# Patient Record
Sex: Female | Born: 1980 | Race: White | Hispanic: Yes | Marital: Married | Smoking: Never smoker
Health system: Southern US, Community
[De-identification: ages and names within clinical notes are randomized; demographics above are authoritative.]

## PROBLEM LIST (undated history)

## (undated) HISTORY — PX: ABDOMINAL HYSTERECTOMY: SHX81

---

## 2002-02-02 ENCOUNTER — Inpatient Hospital Stay (HOSPITAL_COMMUNITY): Admission: AD | Admit: 2002-02-02 | Discharge: 2002-02-04 | Payer: Self-pay | Admitting: Obstetrics and Gynecology

## 2004-06-29 ENCOUNTER — Ambulatory Visit: Payer: Self-pay | Admitting: Family Medicine

## 2004-07-02 ENCOUNTER — Other Ambulatory Visit: Admission: RE | Admit: 2004-07-02 | Discharge: 2004-07-02 | Payer: Self-pay | Admitting: Family Medicine

## 2004-07-02 ENCOUNTER — Ambulatory Visit: Payer: Self-pay | Admitting: Family Medicine

## 2004-07-28 ENCOUNTER — Ambulatory Visit: Payer: Self-pay | Admitting: Family Medicine

## 2004-07-29 ENCOUNTER — Ambulatory Visit (HOSPITAL_COMMUNITY): Admission: RE | Admit: 2004-07-29 | Discharge: 2004-07-29 | Payer: Self-pay | Admitting: Internal Medicine

## 2004-08-21 ENCOUNTER — Ambulatory Visit: Payer: Self-pay | Admitting: Sports Medicine

## 2004-10-02 ENCOUNTER — Ambulatory Visit: Payer: Self-pay | Admitting: Family Medicine

## 2004-10-05 ENCOUNTER — Ambulatory Visit: Payer: Self-pay | Admitting: Family Medicine

## 2004-10-21 ENCOUNTER — Ambulatory Visit: Payer: Self-pay | Admitting: Family Medicine

## 2004-11-06 ENCOUNTER — Ambulatory Visit: Payer: Self-pay | Admitting: Sports Medicine

## 2004-11-26 ENCOUNTER — Ambulatory Visit: Payer: Self-pay | Admitting: Family Medicine

## 2004-12-01 ENCOUNTER — Ambulatory Visit: Payer: Self-pay | Admitting: Family Medicine

## 2004-12-08 ENCOUNTER — Inpatient Hospital Stay (HOSPITAL_COMMUNITY): Admission: AD | Admit: 2004-12-08 | Discharge: 2004-12-09 | Payer: Self-pay | Admitting: *Deleted

## 2004-12-09 ENCOUNTER — Ambulatory Visit: Payer: Self-pay | Admitting: Family Medicine

## 2004-12-10 ENCOUNTER — Ambulatory Visit: Payer: Self-pay | Admitting: Family Medicine

## 2004-12-10 ENCOUNTER — Inpatient Hospital Stay (HOSPITAL_COMMUNITY): Admission: AD | Admit: 2004-12-10 | Discharge: 2004-12-11 | Payer: Self-pay | Admitting: Family Medicine

## 2004-12-13 ENCOUNTER — Ambulatory Visit: Payer: Self-pay | Admitting: *Deleted

## 2004-12-13 ENCOUNTER — Inpatient Hospital Stay (HOSPITAL_COMMUNITY): Admission: AD | Admit: 2004-12-13 | Discharge: 2004-12-13 | Payer: Self-pay | Admitting: Family Medicine

## 2004-12-16 ENCOUNTER — Ambulatory Visit: Payer: Self-pay | Admitting: Sports Medicine

## 2004-12-23 ENCOUNTER — Encounter (INDEPENDENT_AMBULATORY_CARE_PROVIDER_SITE_OTHER): Payer: Self-pay | Admitting: Specialist

## 2004-12-23 ENCOUNTER — Ambulatory Visit: Payer: Self-pay | Admitting: Obstetrics & Gynecology

## 2004-12-23 ENCOUNTER — Ambulatory Visit: Payer: Self-pay | Admitting: Family Medicine

## 2004-12-23 ENCOUNTER — Inpatient Hospital Stay (HOSPITAL_COMMUNITY): Admission: AD | Admit: 2004-12-23 | Discharge: 2004-12-25 | Payer: Self-pay | Admitting: *Deleted

## 2005-02-19 ENCOUNTER — Ambulatory Visit: Payer: Self-pay | Admitting: Family Medicine

## 2005-05-24 ENCOUNTER — Inpatient Hospital Stay (HOSPITAL_COMMUNITY): Admission: EM | Admit: 2005-05-24 | Discharge: 2005-05-26 | Payer: Self-pay | Admitting: Emergency Medicine

## 2005-05-25 ENCOUNTER — Encounter (INDEPENDENT_AMBULATORY_CARE_PROVIDER_SITE_OTHER): Payer: Self-pay | Admitting: *Deleted

## 2005-06-17 ENCOUNTER — Encounter (INDEPENDENT_AMBULATORY_CARE_PROVIDER_SITE_OTHER): Payer: Self-pay | Admitting: *Deleted

## 2005-07-07 ENCOUNTER — Ambulatory Visit: Payer: Self-pay | Admitting: Sports Medicine

## 2005-07-07 ENCOUNTER — Other Ambulatory Visit: Admission: RE | Admit: 2005-07-07 | Discharge: 2005-07-07 | Payer: Self-pay | Admitting: Family Medicine

## 2006-07-15 ENCOUNTER — Encounter (INDEPENDENT_AMBULATORY_CARE_PROVIDER_SITE_OTHER): Payer: Self-pay | Admitting: *Deleted

## 2007-08-16 IMAGING — US US ABDOMEN COMPLETE
1 series · 14 of 25 positions shown · non-contrast
Comparison: none

CLINICAL DATA: Right-sided abdominal pain.  The patient speaks no English.  Point tenderness over gallbladder.
 ABDOMEN ULTRASOUND:
TECHNIQUE: Complete abdominal ultrasound examination was performed including evaluation of the liver, gallbladder, bile ducts, pancreas, kidneys, spleen, IVC, and abdominal aorta.

[Series 1: unknown · 0.25mm/px · 14 of 92 slices shown]
[im 1/92]
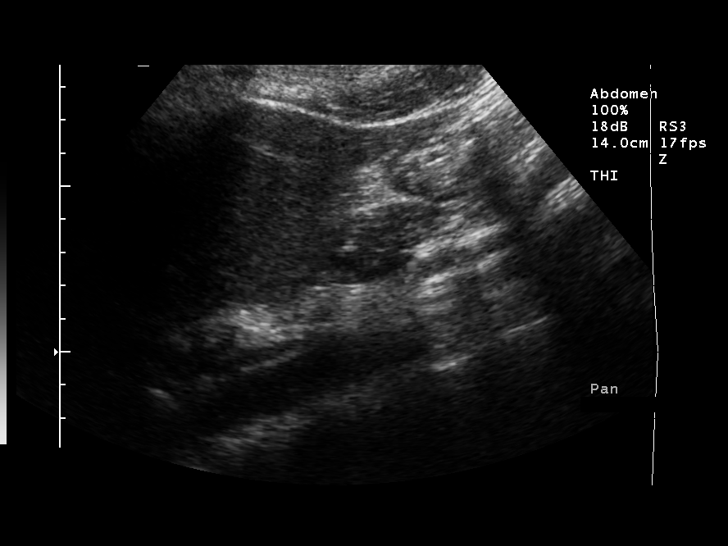
[im 8/92]
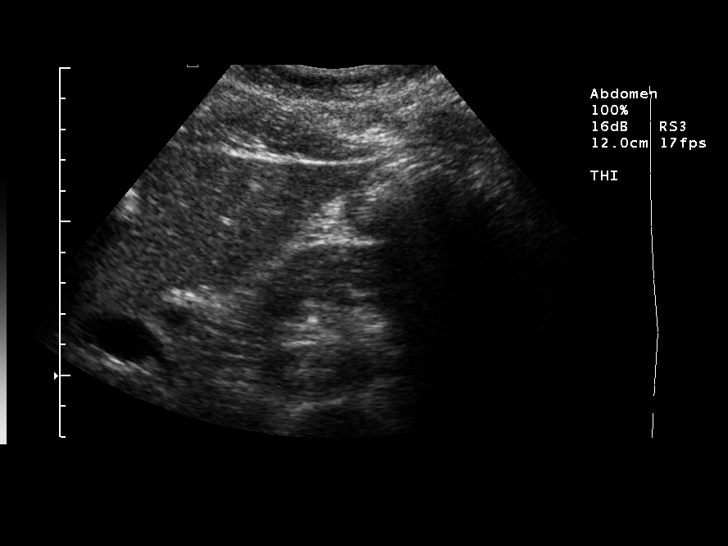
[im 16/92]
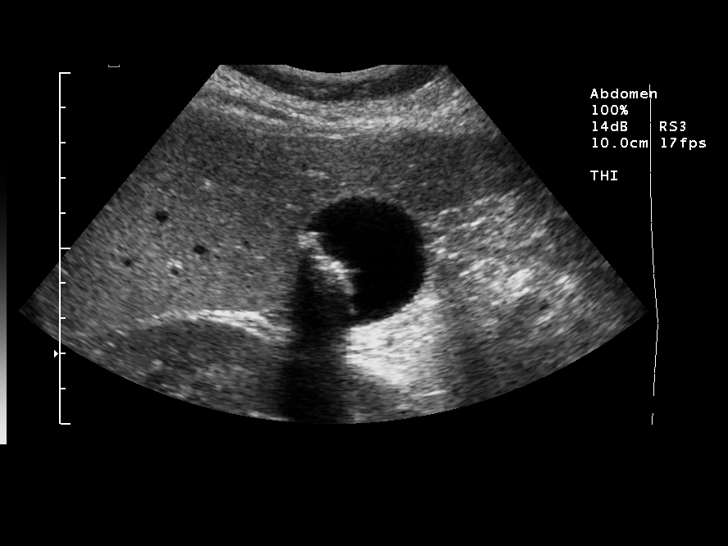
[im 23/92]
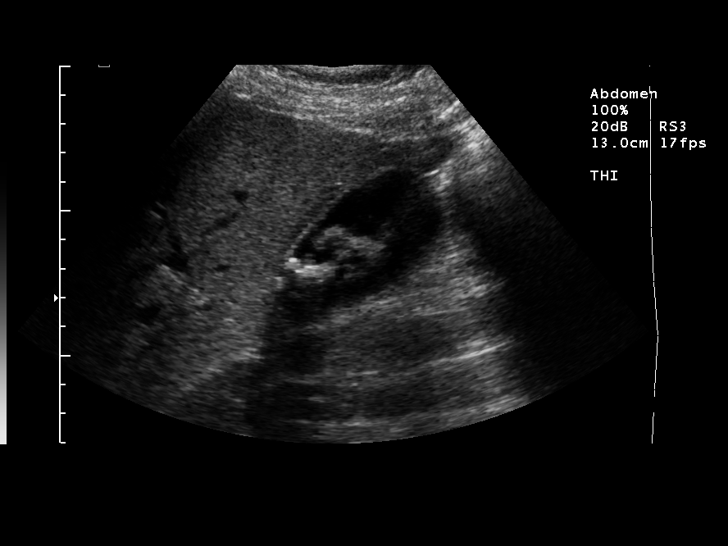
[im 31/92]
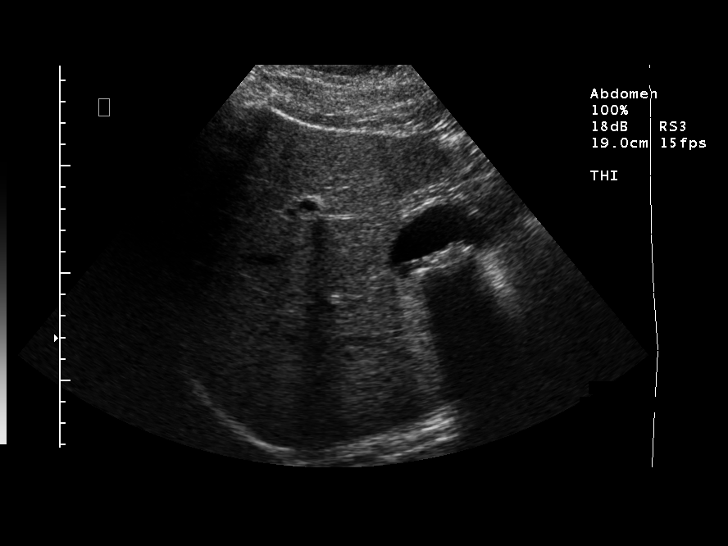
[im 35/92]
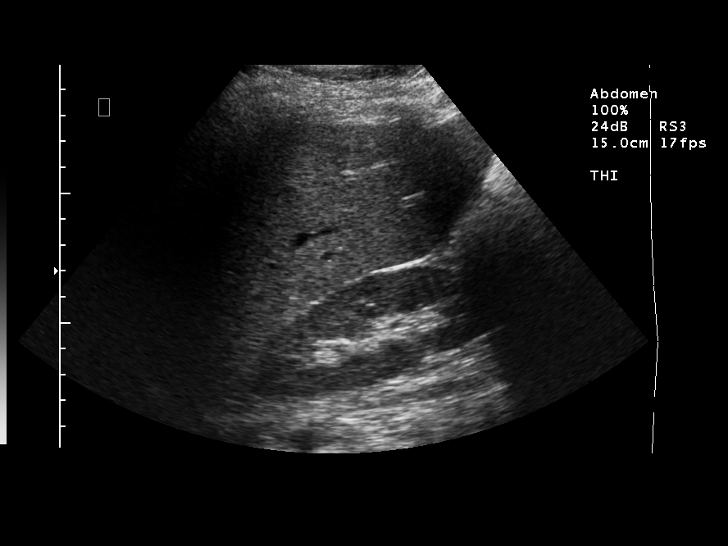
[im 42/92]
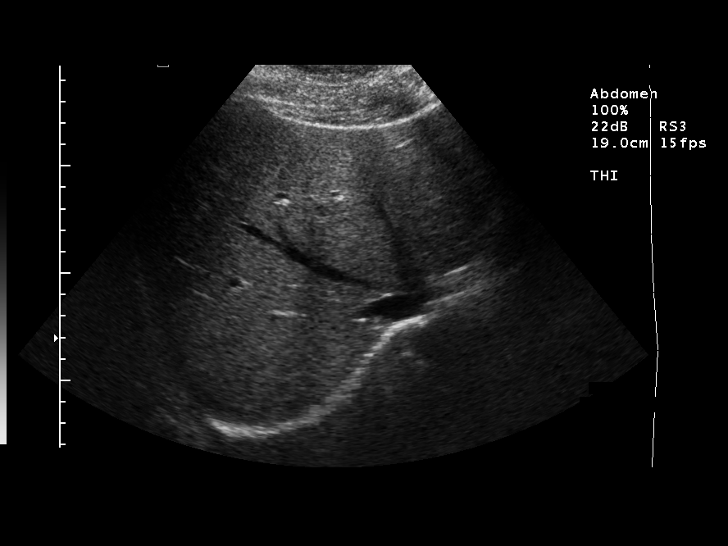
[im 50/92]
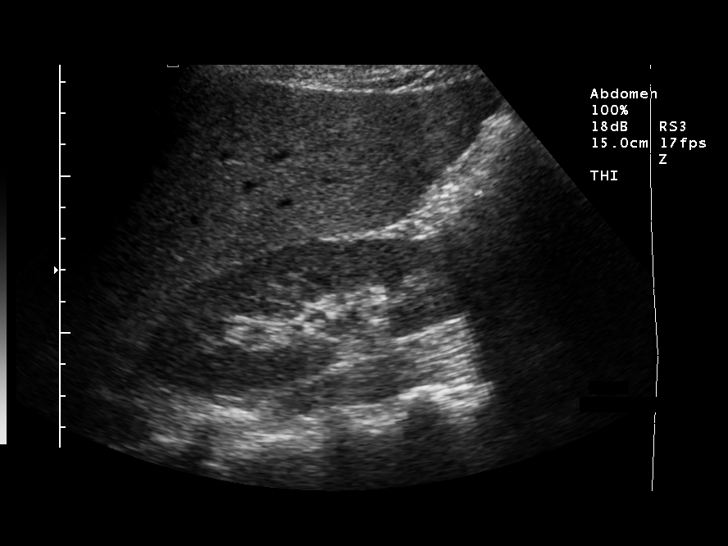
[im 57/92]
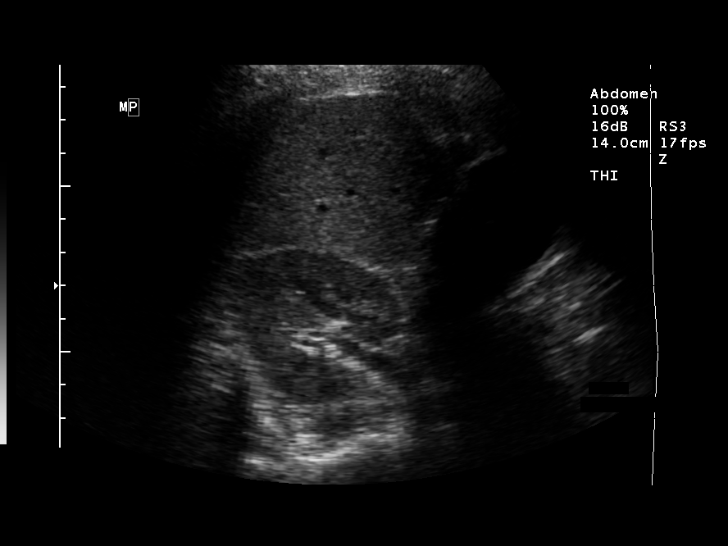
[im 61/92]
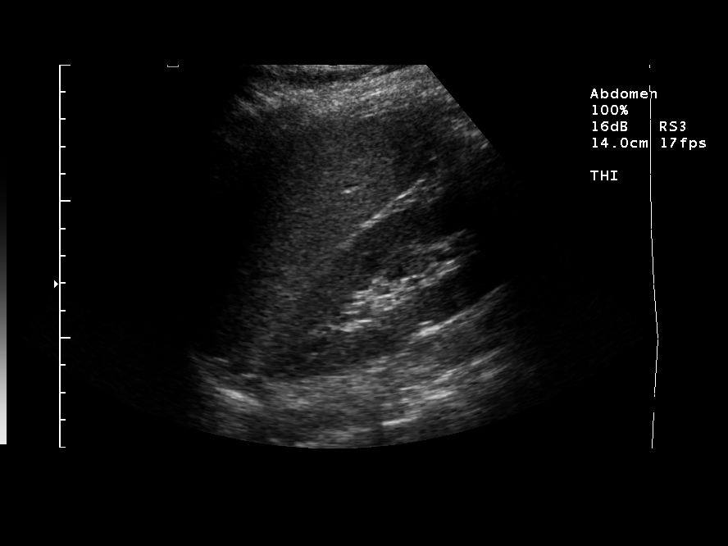
[im 69/92]
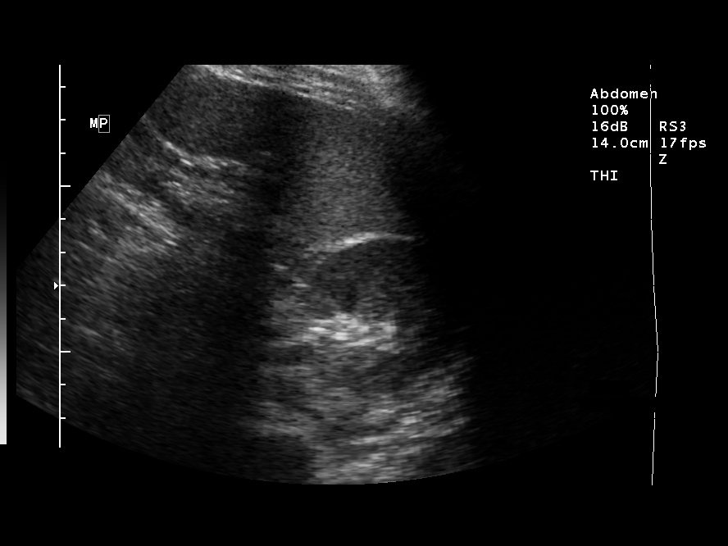
[im 76/92]
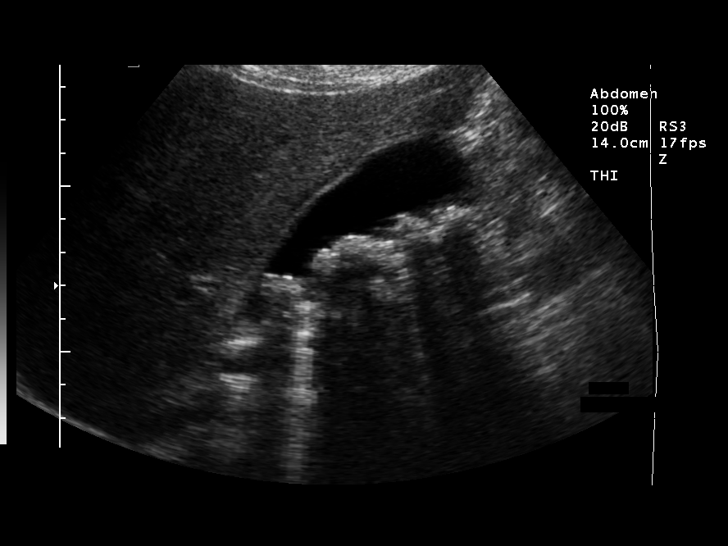
[im 84/92]
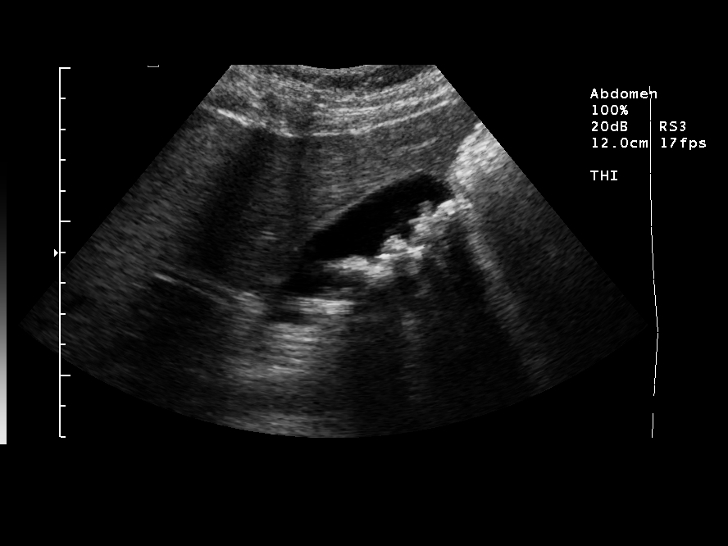
[im 92/92]
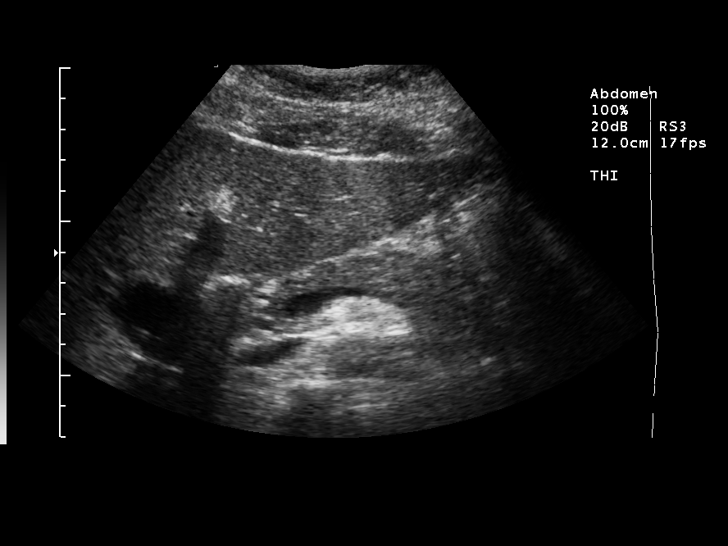

[14 of 25 positions shown; findings below may reference images not displayed]

FINDINGS: Multiple scans of the abdomen are made and show multiple gallstones within the gallbladder.  There is point tenderness over the gallbladder, and there is a small amount of pericholecystic fluid. Gallbladder wall thickness is 2.1 mm.  The common bile duct is normal and measures 4.9 mm.  The liver, inferior vena cava, and pancreas appear normal.  The spleen is normal and measures 11.4 cm.  The right kidney is normal and measures 10.9 cm.  The left kidney is normal and measures 11.4 cm.  The abdominal aorta is normal and measures 1.7 cm.
IMPRESSION: Multiple gallstones within a tender gallbladder which has a small amount of pericholecystic fluid.  Wall thickness is 2.1 mm.  Normal common bile duct, liver, and pancreas.

## 2007-08-17 IMAGING — RF DG CHOLANGIOGRAM OPERATIVE
1 series · 4 of 4 positions shown · non-contrast
Comparison: none

CLINICAL DATA: Acute cholecystitis.  
 OPERATIVE CHOLANGIOGRAM:

[Series 1: run · 4 of 64 frames shown]
[frame 3/64]
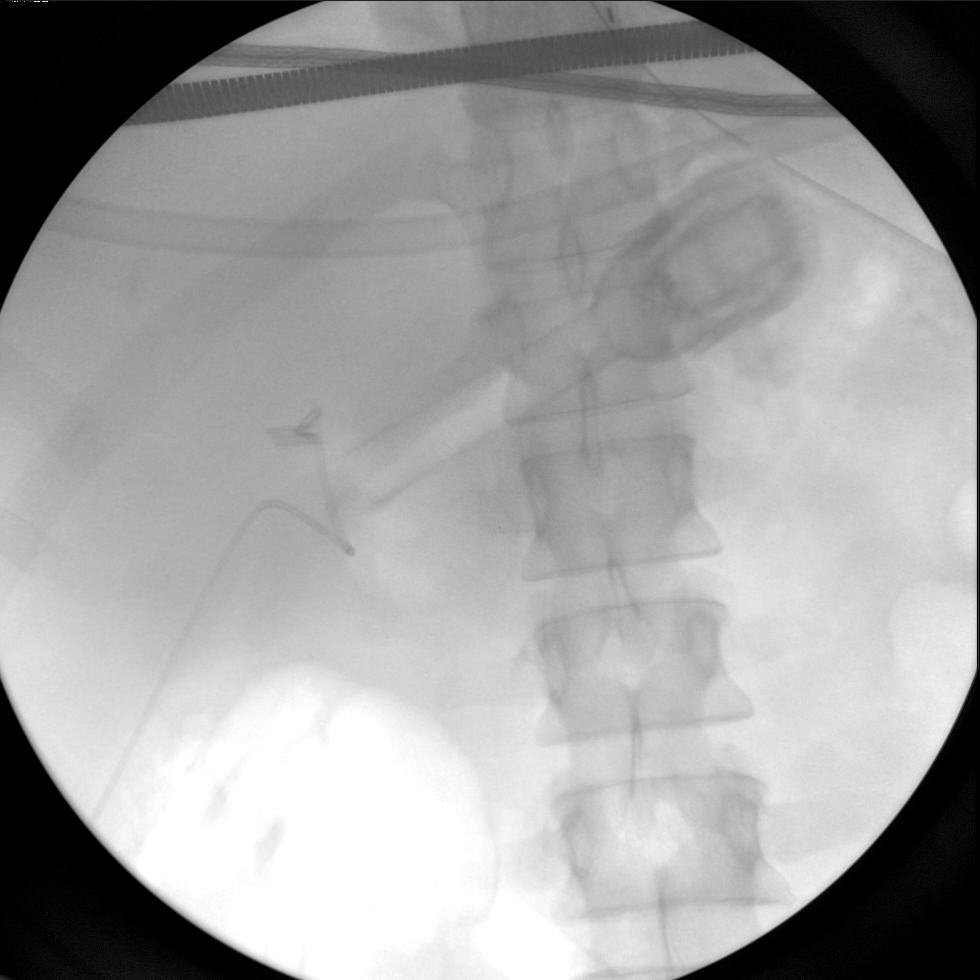
[frame 10/64]
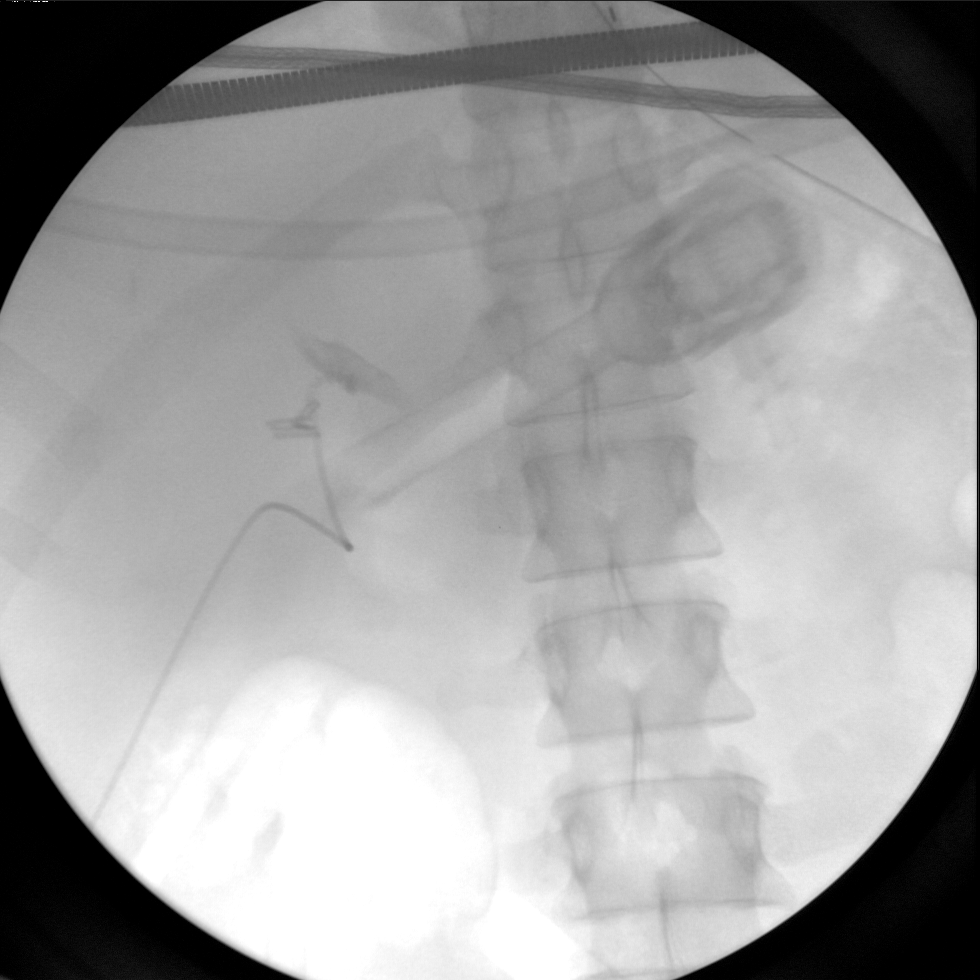
[frame 33/64]
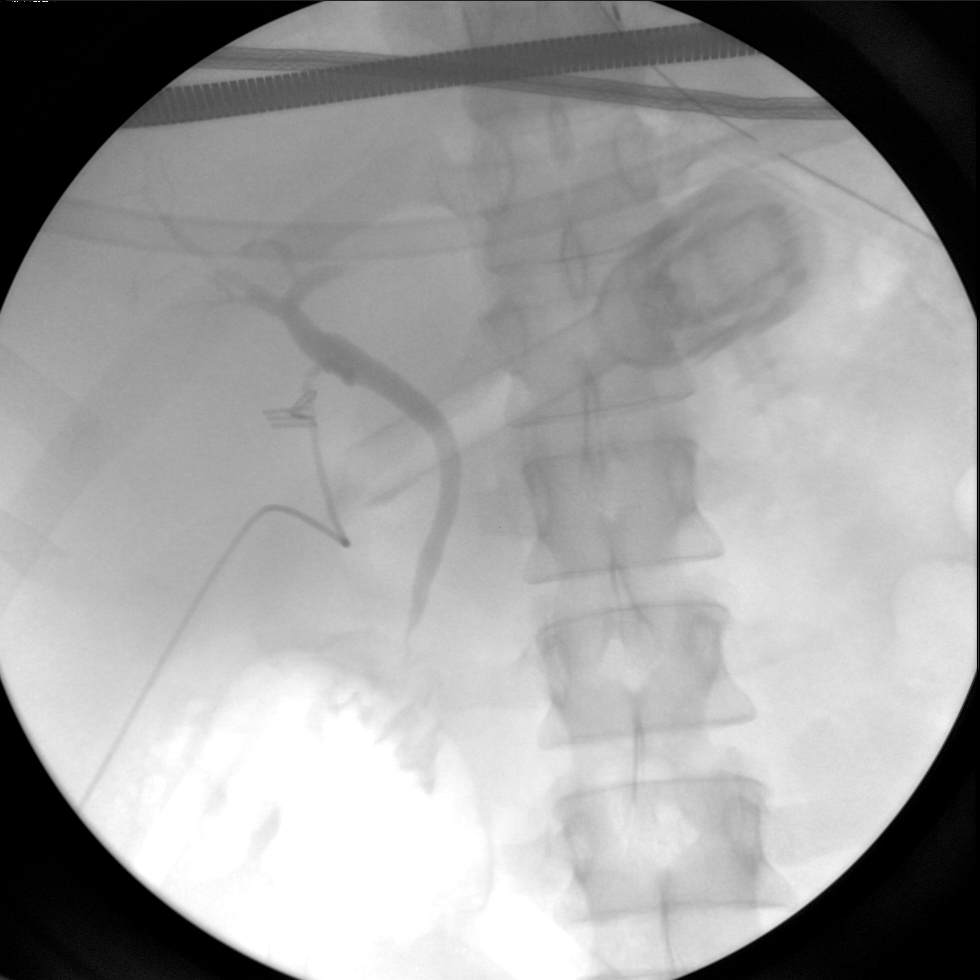
[frame 55/64]
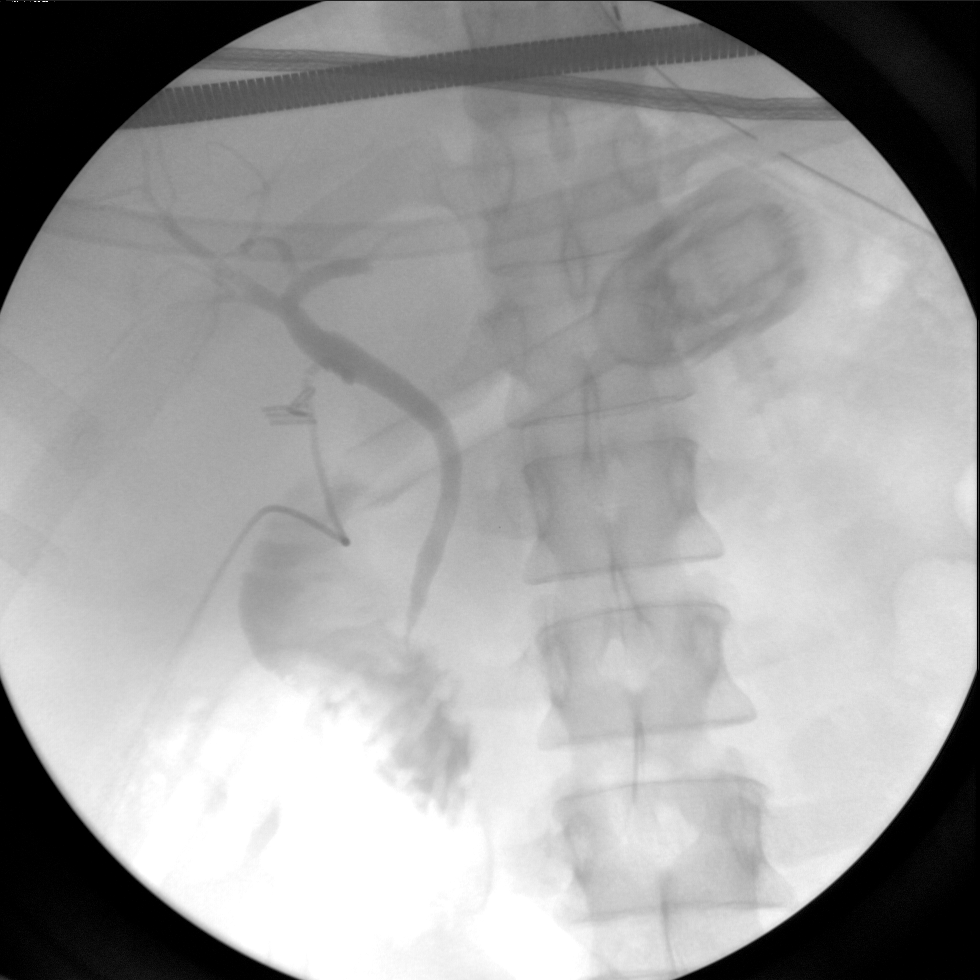

[4 of 4 positions shown; findings below may reference images not displayed]

FINDINGS: An intraoperative C-arm run is reviewed on PACS.  The intra and extrahepatic biliary ducts are normal in caliber.  No filling defects or obstruction.
IMPRESSION: Satisfactory operative cholangiogram.

## 2007-10-20 ENCOUNTER — Encounter (INDEPENDENT_AMBULATORY_CARE_PROVIDER_SITE_OTHER): Payer: Self-pay | Admitting: Family Medicine

## 2007-12-15 ENCOUNTER — Ambulatory Visit: Payer: Self-pay | Admitting: Internal Medicine

## 2007-12-15 ENCOUNTER — Encounter: Payer: Self-pay | Admitting: Family Medicine

## 2007-12-15 LAB — CONVERTED CEMR LAB
BUN: 12 mg/dL (ref 6–23)
Basophils Relative: 0 % (ref 0–1)
Calcium: 8.8 mg/dL (ref 8.4–10.5)
Eosinophils Absolute: 0.3 10*3/uL (ref 0.0–0.7)
Eosinophils Relative: 4 % (ref 0–5)
HCT: 38.7 % (ref 36.0–46.0)
MCHC: 33.9 g/dL (ref 30.0–36.0)
MCV: 90.6 fL (ref 78.0–100.0)
Neutrophils Relative %: 66 % (ref 43–77)
Platelets: 232 10*3/uL (ref 150–400)
Potassium: 4.1 meq/L (ref 3.5–5.3)
Sodium: 136 meq/L (ref 135–145)
TSH: 2.863 microintl units/mL (ref 0.350–4.50)

## 2007-12-16 ENCOUNTER — Ambulatory Visit: Payer: Self-pay | Admitting: *Deleted

## 2007-12-21 ENCOUNTER — Ambulatory Visit (HOSPITAL_COMMUNITY): Admission: RE | Admit: 2007-12-21 | Discharge: 2007-12-21 | Payer: Self-pay | Admitting: Family Medicine

## 2008-02-14 ENCOUNTER — Ambulatory Visit: Payer: Self-pay | Admitting: Obstetrics and Gynecology

## 2008-02-16 ENCOUNTER — Emergency Department (HOSPITAL_COMMUNITY): Admission: EM | Admit: 2008-02-16 | Discharge: 2008-02-16 | Payer: Self-pay | Admitting: Emergency Medicine

## 2008-02-22 ENCOUNTER — Ambulatory Visit (HOSPITAL_COMMUNITY): Admission: RE | Admit: 2008-02-22 | Discharge: 2008-02-22 | Payer: Self-pay | Admitting: Obstetrics and Gynecology

## 2008-03-04 ENCOUNTER — Encounter: Payer: Self-pay | Admitting: Obstetrics & Gynecology

## 2008-03-04 ENCOUNTER — Inpatient Hospital Stay (HOSPITAL_COMMUNITY): Admission: RE | Admit: 2008-03-04 | Discharge: 2008-03-06 | Payer: Self-pay | Admitting: Obstetrics & Gynecology

## 2008-03-04 ENCOUNTER — Ambulatory Visit: Payer: Self-pay | Admitting: Obstetrics & Gynecology

## 2008-03-11 ENCOUNTER — Inpatient Hospital Stay (HOSPITAL_COMMUNITY): Admission: AD | Admit: 2008-03-11 | Discharge: 2008-03-14 | Payer: Self-pay | Admitting: Family Medicine

## 2008-03-11 ENCOUNTER — Ambulatory Visit: Payer: Self-pay | Admitting: Obstetrics & Gynecology

## 2008-03-12 ENCOUNTER — Encounter: Payer: Self-pay | Admitting: Obstetrics & Gynecology

## 2008-03-18 ENCOUNTER — Ambulatory Visit (HOSPITAL_COMMUNITY): Admission: RE | Admit: 2008-03-18 | Discharge: 2008-03-18 | Payer: Self-pay | Admitting: Obstetrics & Gynecology

## 2008-04-04 ENCOUNTER — Ambulatory Visit (HOSPITAL_COMMUNITY): Admission: RE | Admit: 2008-04-04 | Discharge: 2008-04-04 | Payer: Self-pay | Admitting: Interventional Radiology

## 2008-04-05 ENCOUNTER — Encounter: Payer: Self-pay | Admitting: Family

## 2008-04-05 ENCOUNTER — Ambulatory Visit: Payer: Self-pay | Admitting: Obstetrics and Gynecology

## 2008-04-05 LAB — CONVERTED CEMR LAB: Trich, Wet Prep: NONE SEEN

## 2008-04-18 ENCOUNTER — Inpatient Hospital Stay (HOSPITAL_COMMUNITY): Admission: RE | Admit: 2008-04-18 | Discharge: 2008-04-19 | Payer: Self-pay | Admitting: Urology

## 2008-04-26 ENCOUNTER — Emergency Department (HOSPITAL_COMMUNITY): Admission: EM | Admit: 2008-04-26 | Discharge: 2008-04-27 | Payer: Self-pay | Admitting: Emergency Medicine

## 2008-04-29 ENCOUNTER — Emergency Department (HOSPITAL_COMMUNITY): Admission: EM | Admit: 2008-04-29 | Discharge: 2008-04-30 | Payer: Self-pay | Admitting: Emergency Medicine

## 2008-05-03 ENCOUNTER — Ambulatory Visit: Payer: Self-pay | Admitting: Obstetrics & Gynecology

## 2008-06-05 ENCOUNTER — Ambulatory Visit: Payer: Self-pay | Admitting: Obstetrics and Gynecology

## 2008-07-29 ENCOUNTER — Ambulatory Visit: Payer: Self-pay | Admitting: Internal Medicine

## 2008-08-19 ENCOUNTER — Ambulatory Visit: Payer: Self-pay | Admitting: Internal Medicine

## 2008-08-19 ENCOUNTER — Encounter: Payer: Self-pay | Admitting: Family Medicine

## 2008-08-19 LAB — CONVERTED CEMR LAB
BUN: 17 mg/dL (ref 6–23)
CO2: 19 meq/L (ref 19–32)
Calcium: 9.3 mg/dL (ref 8.4–10.5)
Glucose, Bld: 91 mg/dL (ref 70–99)
Potassium: 4.2 meq/L (ref 3.5–5.3)
Sodium: 139 meq/L (ref 135–145)

## 2008-08-29 ENCOUNTER — Ambulatory Visit: Payer: Self-pay | Admitting: Internal Medicine

## 2008-11-11 ENCOUNTER — Ambulatory Visit: Payer: Self-pay | Admitting: Internal Medicine

## 2008-11-13 ENCOUNTER — Ambulatory Visit (HOSPITAL_COMMUNITY): Admission: RE | Admit: 2008-11-13 | Discharge: 2008-11-13 | Payer: Self-pay | Admitting: Family Medicine

## 2008-11-15 ENCOUNTER — Emergency Department (HOSPITAL_COMMUNITY): Admission: EM | Admit: 2008-11-15 | Discharge: 2008-11-16 | Payer: Self-pay | Admitting: Emergency Medicine

## 2008-11-20 ENCOUNTER — Ambulatory Visit: Payer: Self-pay | Admitting: Internal Medicine

## 2008-11-27 ENCOUNTER — Ambulatory Visit: Payer: Self-pay | Admitting: *Deleted

## 2008-12-13 ENCOUNTER — Ambulatory Visit (HOSPITAL_COMMUNITY): Admission: RE | Admit: 2008-12-13 | Discharge: 2008-12-13 | Payer: Self-pay | Admitting: Urology

## 2009-01-22 ENCOUNTER — Encounter: Payer: Self-pay | Admitting: Family Medicine

## 2009-01-22 ENCOUNTER — Ambulatory Visit: Payer: Self-pay | Admitting: Internal Medicine

## 2009-01-22 LAB — CONVERTED CEMR LAB
Alkaline Phosphatase: 53 units/L (ref 39–117)
BUN: 11 mg/dL (ref 6–23)
Basophils Absolute: 0 10*3/uL (ref 0.0–0.1)
Basophils Relative: 0 % (ref 0–1)
CO2: 18 meq/L — ABNORMAL LOW (ref 19–32)
Cholesterol: 201 mg/dL — ABNORMAL HIGH (ref 0–200)
Creatinine, Ser: 0.68 mg/dL (ref 0.40–1.20)
Eosinophils Absolute: 0.1 10*3/uL (ref 0.0–0.7)
Eosinophils Relative: 2 % (ref 0–5)
Glucose, Bld: 96 mg/dL (ref 70–99)
HCT: 40.5 % (ref 36.0–46.0)
HDL: 38 mg/dL — ABNORMAL LOW (ref >39)
Hemoglobin: 13.1 g/dL (ref 12.0–15.0)
LDL Cholesterol: 92 mg/dL (ref 0–99)
MCHC: 32.3 g/dL (ref 30.0–36.0)
MCV: 95.3 fL (ref 78.0–100.0)
Monocytes Absolute: 0.3 10*3/uL (ref 0.1–1.0)
Monocytes Relative: 5 % (ref 3–12)
Neutro Abs: 3.6 10*3/uL (ref 1.7–7.7)
RBC: 4.25 M/uL (ref 3.87–5.11)
RDW: 13.9 % (ref 11.5–15.5)
Sodium: 139 meq/L (ref 135–145)
Total Bilirubin: 0.5 mg/dL (ref 0.3–1.2)
Total Protein: 7.3 g/dL (ref 6.0–8.3)
Triglycerides: 353 mg/dL — ABNORMAL HIGH (ref <150)
VLDL: 71 mg/dL — ABNORMAL HIGH (ref 0–40)

## 2009-01-29 ENCOUNTER — Ambulatory Visit: Payer: Self-pay | Admitting: Internal Medicine

## 2009-03-24 ENCOUNTER — Ambulatory Visit: Payer: Self-pay | Admitting: Internal Medicine

## 2009-05-08 ENCOUNTER — Ambulatory Visit: Payer: Self-pay | Admitting: Family Medicine

## 2010-03-05 ENCOUNTER — Emergency Department (HOSPITAL_COMMUNITY)
Admission: EM | Admit: 2010-03-05 | Discharge: 2010-03-06 | Payer: Self-pay | Source: Home / Self Care | Admitting: Emergency Medicine

## 2010-04-23 ENCOUNTER — Ambulatory Visit: Payer: Self-pay | Admitting: Obstetrics & Gynecology

## 2010-04-27 ENCOUNTER — Ambulatory Visit (HOSPITAL_COMMUNITY)
Admission: RE | Admit: 2010-04-27 | Discharge: 2010-04-27 | Payer: Self-pay | Source: Home / Self Care | Attending: Family Medicine | Admitting: Family Medicine

## 2010-06-02 ENCOUNTER — Emergency Department (HOSPITAL_COMMUNITY)
Admission: EM | Admit: 2010-06-02 | Discharge: 2010-06-02 | Payer: Self-pay | Source: Home / Self Care | Admitting: Emergency Medicine

## 2010-06-04 IMAGING — US IR US GUIDANCE
1 series · 1 of 1 positions shown · non-contrast
Comparison: none

CLINICAL DATA: Distal left ureteral injury following hysterectomy
with CT evidence of significant hydronephrosis and delayed function
of the left kidney.  The patient presents for percutaneous
nephrostomy.

[Series 1: sp us guide bx asp/drain · 1 of 1 slices shown]
[im 1/1]
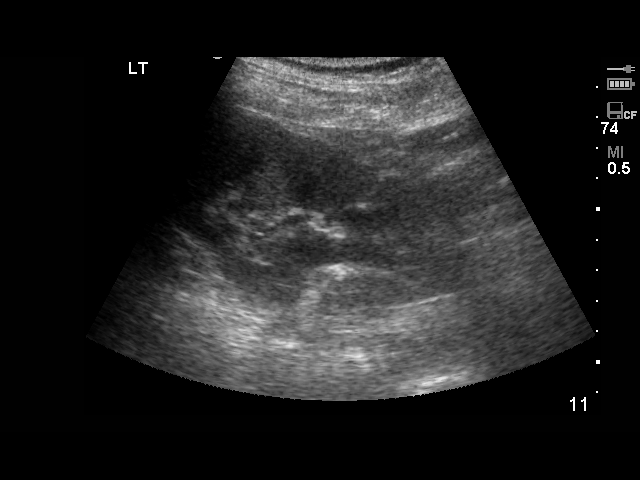

[1 of 1 positions shown; findings below may reference images not displayed]

LEFT PERCUTANEOUS NEPHROSTOMY TUBE PLACEMENT WITH ULTRASOUND
GUIDANCE FOR NEEDLE PUNCTURE

Sedation: 6.0 mg IV Versed; 200 mcg IV Fentanyl.

Total Moderate Sedation Time: 35 minutes.

Contrast Volume: 10 ml

Fluoroscropy Time: 3.3 minutes.

Procedure: The procedure, risks, benefits, and alternatives were
explained to the patient. Questions regarding the procedure were
encouraged and answered. The patient understands and consents to
the procedure.

The left flank region was prepped with betadine in a sterile
fashion, and a sterile drape was applied covering the operative
field. A sterile gown and sterile gloves were used for the
procedure. Local anesthesia was provided with 1% Lidocaine.

The left kidney was localized by ultrasound.  Under direct
ultrasound guidance a 21-gauge needle was advanced into the lower
pole collecting system.  Ultrasound image documentation was
performed of needle position.

After aspiration of urine, diluted contrast material was injected
into the collecting system.  A guidewire was then advanced.  A
transitional dilator was placed.  The percutaneous tract was then
dilated to 10-French.

A 10-French pigtail nephrostomy tube was advanced and formed.  The
catheter was injected with contrast material and a fluoroscopic
spot image obtained to confirm position.  The catheter was flushed
and connected to a gravity drainage bag.  It was secured at the
skin with a Prolene retention suture and Stat-Lock device.

Complications: None
FINDINGS: Ultrasound confirms significant residual left
hydronephrosis.  Aspirated urine was clear.  A sample was sent for
culture.  The nephrostomy tube was formed at the level of the renal
pelvis.

Attempt was not made today to try to cross the level of distal
ureteral obstruction as it is unlikely to be successful this early
after surgery.  The patient will be brought back approximately 1
week for nephrostogram evaluation and possible antegrade ureteral
stent placement.
IMPRESSION: Left percutaneous nephrostomy tube placement for hydronephrosis
secondary to distal ureteral obstruction/injury.  A 10 French tube
was formed at the level of the renal pelvis.  Plans are to have the
patient return in approximately 1 week for attempted antegrade
ureteral stent placement which would then allow removal of the
nephrostomy tube.

## 2010-06-06 ENCOUNTER — Encounter: Payer: Self-pay | Admitting: Family Medicine

## 2010-06-09 ENCOUNTER — Encounter: Payer: Self-pay | Admitting: Family Medicine

## 2010-06-09 LAB — CONVERTED CEMR LAB
AST: 15 units/L (ref 0–37)
Alkaline Phosphatase: 48 units/L (ref 39–117)
BUN: 14 mg/dL (ref 6–23)
Basophils Relative: 0 % (ref 0–1)
Eosinophils Absolute: 0.1 10*3/uL (ref 0.0–0.7)
Eosinophils Relative: 2 % (ref 0–5)
Glucose, Bld: 84 mg/dL (ref 70–99)
HCT: 39.7 % (ref 36.0–46.0)
HDL: 54 mg/dL (ref >39)
LDL Cholesterol: 109 mg/dL — ABNORMAL HIGH (ref 0–99)
Lymphs Abs: 2.2 10*3/uL (ref 0.7–4.0)
MCHC: 33.8 g/dL (ref 30.0–36.0)
MCV: 93.2 fL (ref 78.0–100.0)
Monocytes Relative: 5 % (ref 3–12)
Platelets: 206 10*3/uL (ref 150–400)
Total Bilirubin: 0.5 mg/dL (ref 0.3–1.2)
Total CHOL/HDL Ratio: 3.8
Triglycerides: 210 mg/dL — ABNORMAL HIGH (ref <150)
VLDL: 42 mg/dL — ABNORMAL HIGH (ref 0–40)
WBC: 6.2 10*3/uL (ref 4.0–10.5)

## 2010-06-27 IMAGING — XA IR BILIARY CATHETER EXCHANGE
1 series · 5 of 5 positions shown · non-contrast
Comparison: none

CLINICAL DATA: Distal left ureteral obstruction after hysterectomy.
Original nephrostomy tube placed on 03/12/2008.  Attempt at
crossing distal ureteral obstruction was unsuccessful on
03/18/2008.  Reattempt is now made to try to cross the obstruction
to allow antegrade ureteral stent placement.

[Series 1000: run · 0.18mm/px · 5 of 5 slices shown]
[im 1/5]
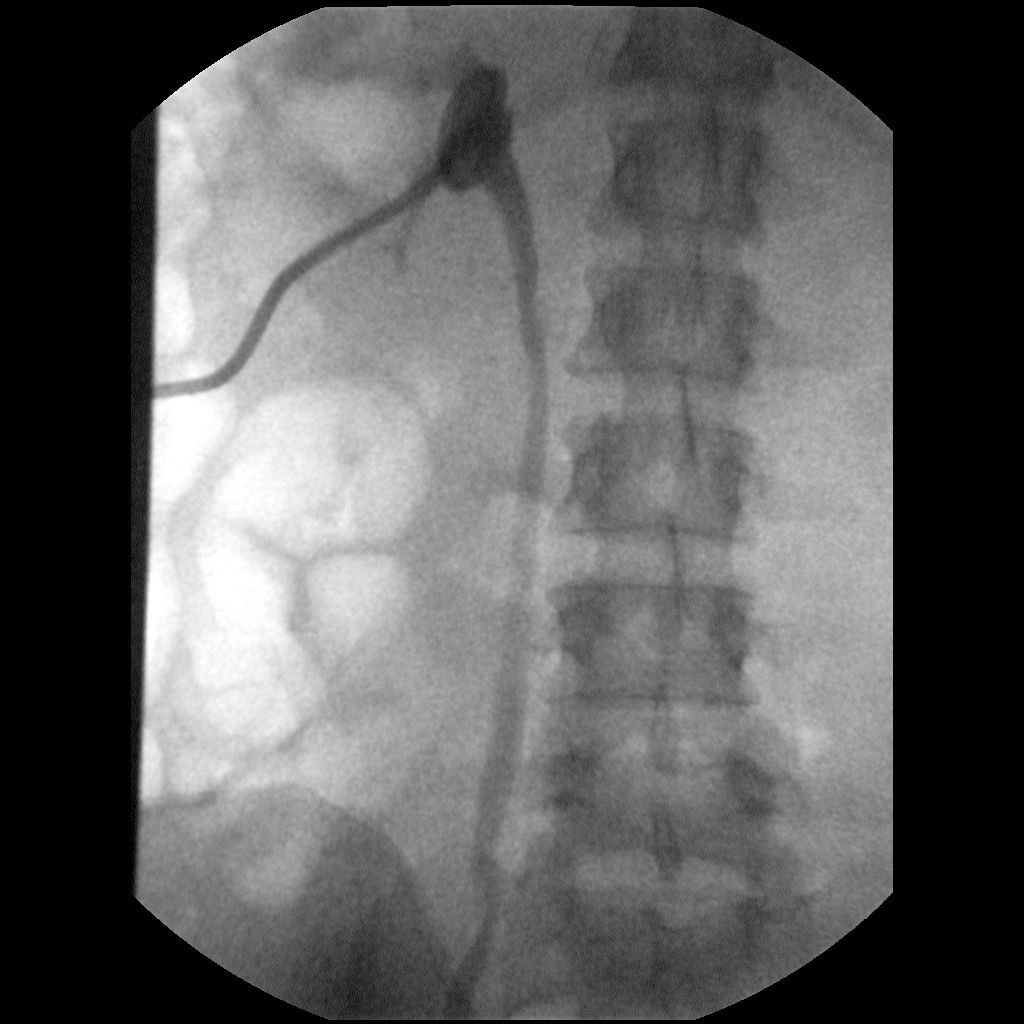
[im 2/5]
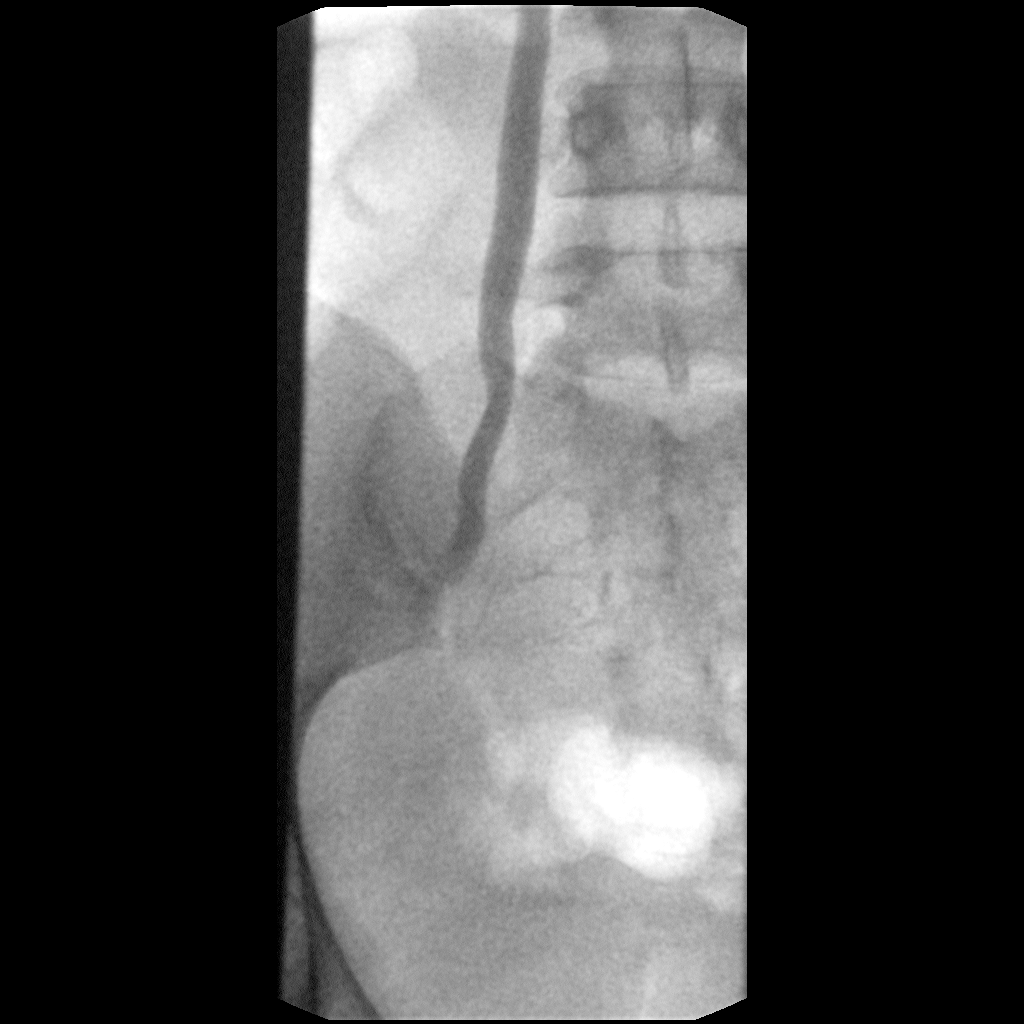
[im 3/5]
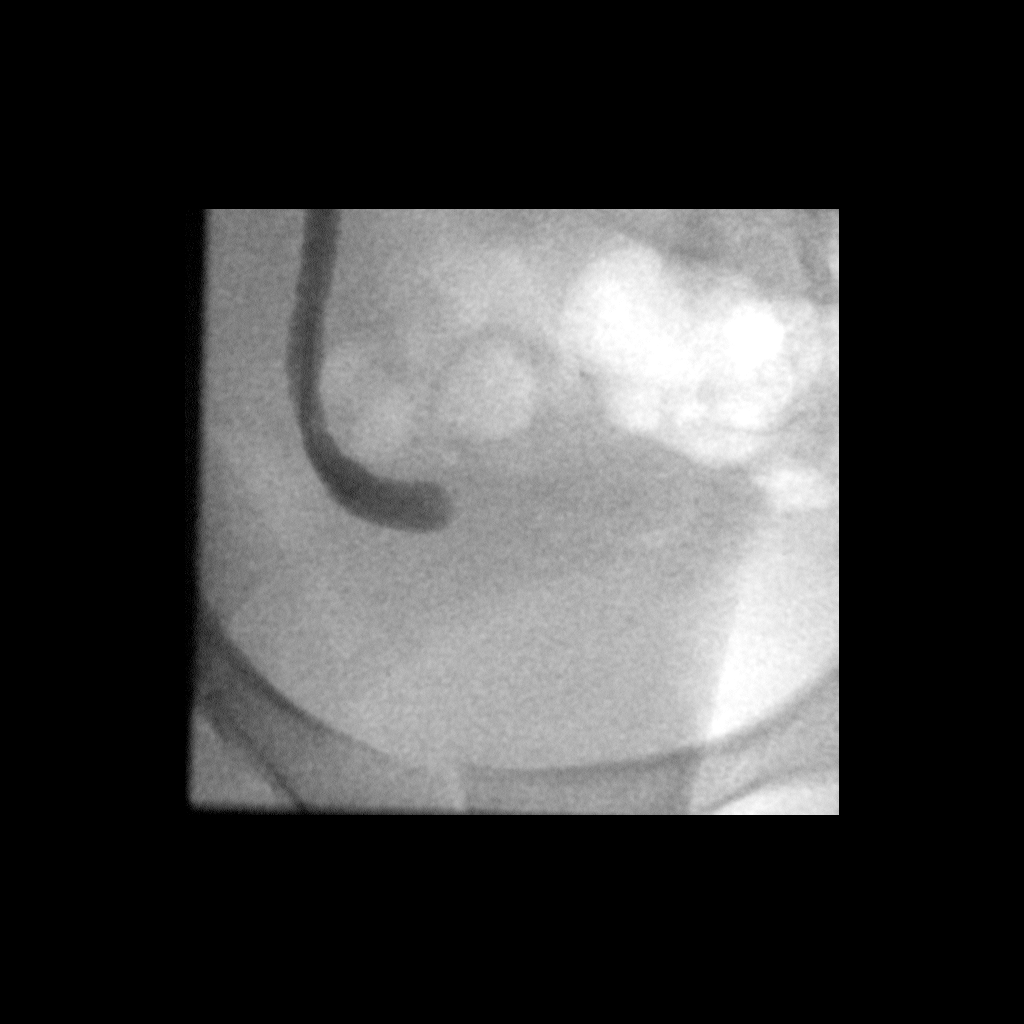
[im 4/5]
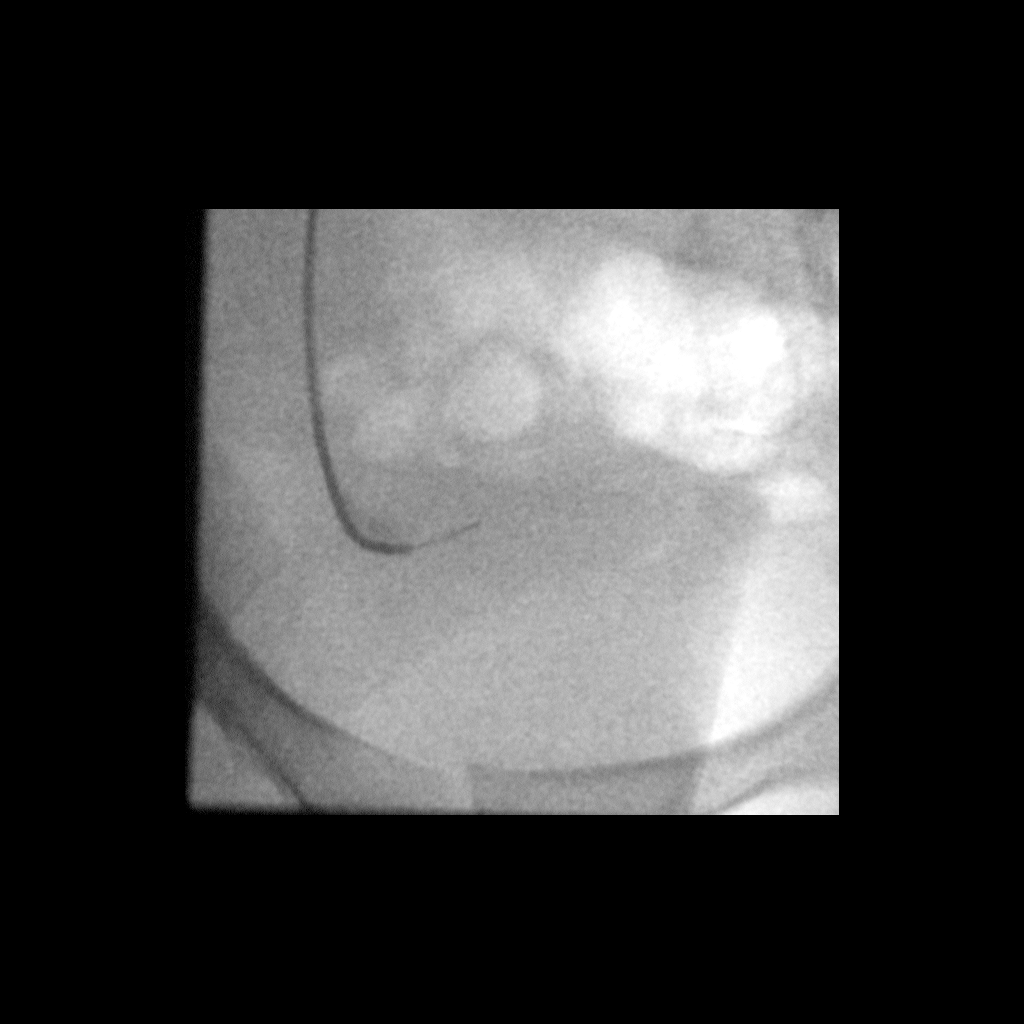
[im 5/5]
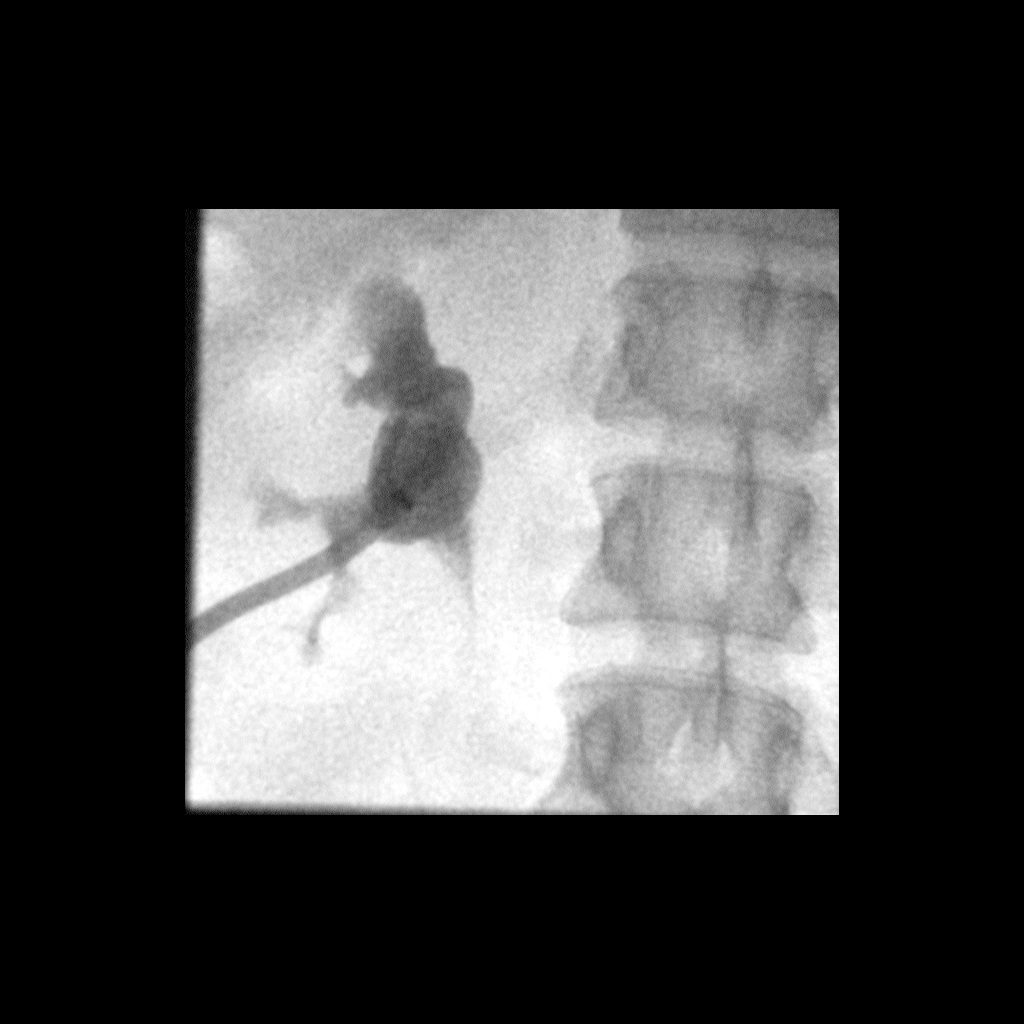

[5 of 5 positions shown; findings below may reference images not displayed]

1.  LEFT NEPHROSTOGRAM.
2.  LEFT PERCUTANEOUS NEPHROSTOMY TUBE REPLACEMENT

Sedation: 2.0 mg IV Versed; 100 mcg IV Fentanyl.

Total Moderate Sedation Time: 15 minutes.

Contrast Volume: 15 ml

Additional Medications: 400 mg IV Cipro

Fluoroscopy Time: 5.9 minutes.

Procedure:  The procedure, risks, benefits, and alternatives were
explained to the patient.  Questions regarding the procedure were
encouraged and answered.  The patient understands and consents to
the procedure.

The left flank region and preexisting nephrostomy tube was prepped
with Betadine in a sterile fashion, and a sterile drape was applied
covering the operative field.  A sterile gown and sterile gloves
were used for the procedure.

A nephrostogram was performed by injection of the nephrostomy tube.
Additional opacification of the distal ureter was achieved after
removal of the nephrostomy tube and advancement of a 5-French
catheter into the ureter.

Attempt was made to cross the level of distal ureteral obstruction
with different guide wires.  Eventually, this attempt was aborted.
A new 10-French nephrostomy tube was then placed and formed in the
renal pelvis.  Fluoroscopic spot image was obtained to confirm
catheter position.  The catheter was resutured at the skin with a
Prolene retention suture and Stat-Lock device.  It was then
connected to a gravity drainage bag.

Complications: None
FINDINGS: There is a persistent, high-grade total obstruction of
the distal ureter very near the ureterovesicle junction.  This
again could not be crossed by a guidewire to allow antegrade stent
placement.  Antegrade stent placement will likely not be possible
given the high-grade obstruction despite waiting several weeks
after nephrostomy tube placement.  The patient is to follow up with
Dr. Jaku tomorrow.
IMPRESSION: Inability to cross the high-grade distal left ureteral obstruction
on reattempt.  A new 10-French nephrostomy tube was therefore
placed.  Patient is to see Dr. Jaku tomorrow and will likely
require surgical repair given failure on reattempt to cross the
left ureteral obstruction from the antegrade approach.

## 2010-07-29 LAB — URINALYSIS, ROUTINE W REFLEX MICROSCOPIC
Bilirubin Urine: NEGATIVE
Leukocytes, UA: NEGATIVE
Nitrite: NEGATIVE
Specific Gravity, Urine: 1.031 — ABNORMAL HIGH (ref 1.005–1.030)
pH: 5.5 (ref 5.0–8.0)

## 2010-07-29 LAB — DIFFERENTIAL
Basophils Absolute: 0 10*3/uL (ref 0.0–0.1)
Lymphocytes Relative: 35 % (ref 12–46)
Monocytes Absolute: 0.5 10*3/uL (ref 0.1–1.0)
Neutro Abs: 4.4 10*3/uL (ref 1.7–7.7)

## 2010-07-29 LAB — COMPREHENSIVE METABOLIC PANEL
Albumin: 3.9 g/dL (ref 3.5–5.2)
BUN: 13 mg/dL (ref 6–23)
Calcium: 8.9 mg/dL (ref 8.4–10.5)
Chloride: 106 mEq/L (ref 96–112)
Creatinine, Ser: 0.67 mg/dL (ref 0.4–1.2)
Total Bilirubin: 0.4 mg/dL (ref 0.3–1.2)
Total Protein: 6.9 g/dL (ref 6.0–8.3)

## 2010-07-29 LAB — URINE MICROSCOPIC-ADD ON

## 2010-07-29 LAB — CBC
MCH: 31.3 pg (ref 26.0–34.0)
MCHC: 34.7 g/dL (ref 30.0–36.0)
MCV: 90.2 fL (ref 78.0–100.0)
Platelets: 190 10*3/uL (ref 150–400)
RDW: 12 % (ref 11.5–15.5)

## 2010-07-29 LAB — GC/CHLAMYDIA PROBE AMP, GENITAL: Chlamydia, DNA Probe: NEGATIVE

## 2010-08-23 LAB — URINE MICROSCOPIC-ADD ON

## 2010-08-23 LAB — URINALYSIS, ROUTINE W REFLEX MICROSCOPIC
Glucose, UA: NEGATIVE mg/dL
Specific Gravity, Urine: 1.009 (ref 1.005–1.030)

## 2010-08-23 LAB — POCT I-STAT, CHEM 8
BUN: 13 mg/dL (ref 6–23)
Creatinine, Ser: 1 mg/dL (ref 0.4–1.2)
Hemoglobin: 11.6 g/dL — ABNORMAL LOW (ref 12.0–15.0)
Potassium: 3.7 mEq/L (ref 3.5–5.1)
Sodium: 140 mEq/L (ref 135–145)

## 2010-09-29 NOTE — Discharge Summary (Signed)
NAMEBYRON, Carmen Peters        ACCOUNT NO.:  [id number]   MEDICAL RECORD NO.:  [id number]          PATIENT TYPE:  INP   LOCATION:  9317                          FACILITY:  [facility]   PHYSICIAN:  Norton Blizzard, MD    DATE OF BIRTH:  December 09, 1980   DATE OF ADMISSION:  03/04/2008  DATE OF DISCHARGE:  03/06/2008                               DISCHARGE SUMMARY   PREOPERATIVE DIAGNOSES:  Chronic pelvic pain, pelvic mass, undesired  fertility.   POSTOPERATIVE DIAGNOSES:  Chronic pelvic pain, pelvic mass, undesired  fertility, benign solid ovarian mass.   PROCEDURES:  Total abdominal hysterectomy, right salpingo-oophorectomy  via Pfannenstiel incision   BRIEF HOSPITAL COURSE:  The patient is a 30 year old Hispanic female who  is gravida 2, para 2 who had a long history of chronic pelvic pain and a  pelvic mass.  The patient also had undesired fertility.  She desired  definitive surgical management.  For further details of this operation,  please refer to separate dictated operative report.  The patient had an  uncomplicated postoperative course.  Her postoperative hemoglobin was  9.7 from a preoperative value of 12.4.  She had no signs or symptoms of  anemia.  By postoperative day #2, her pain was well controlled on oral  pain medication.  She was tolerating regular diet, ambulating without  difficulty and passing flatus.  The patient was deemed stable for  discharge to home.   DISCHARGE CONDITION:  Stable.   DISCHARGE MEDICATIONS:  1. Percocet 5/325 mg 1-2 tablets p.o. q.6 h. p.r.n. Pain.  2. Ibuprofen 600 mg p.o. q.6 h. p.r.n. pain.  3. Colace 100 mg p.o. b.i.d. p.r.n. constipation.   DISCHARGE INSTRUCTIONS:  The patient was told to avoid anything in the  vagina for the next 6 weeks and to avoid lifting anything greater than  15 pounds for the next 6 weeks.  She was told to shower for the next few  weeks and no restrictions on her diet or activity.  The patient was told  to keep  her incision clean and dry and she was also told to call or come  to the emergency room for any fevers, abnormal drainage, abnormal  bleeding or any other concerns.   FOLLOWUP APPOINTMENTS:  The patient has scheduled followup appointment  with me and GYN clinic on March 20, 2008. at 1:15 p.m.  This  information was communicated to the patient and her husband.      Norton Blizzard, MD  Electronically Signed     UAD/MEDQ  D:  03/06/2008  T:  03/06/2008  Job:  [id number]

## 2010-09-29 NOTE — Group Therapy Note (Signed)
NAME:  Carmen Peters, CONNON NO.:  [id number]   MEDICAL RECORD NO.:  [id number]          PATIENT TYPE:  WOC   LOCATION:  [facility]                   FACILITY:  [facility]   PHYSICIAN:  Sid Falcon, CNM  DATE OF BIRTH:  1980/05/21   DATE OF SERVICE:  04/05/2008                                  CLINIC NOTE   The patient is here for followup, status post a hysterectomy and right  salpingo-oophorectomy on March 04, 2008, for a pelvic pain and right  ovarian cyst.  The patient is reporting no problems with bowel sounds.  Pain is well controlled, last pain medication was 2 weeks ago.  Bleeding  is none at this time.  The last spotting occurred 4 days ago.  The  patient was seen 1 week on March 11, 2008, at [facility] for left  flank pain.  The patient was subsequently diagnosed with left  hydronephrosis and a nephrostomy tube was placed and still is in place.  The patient is receiving followup at [facility] for this and has an  appointment scheduled for today.  The patient is also reporting vaginal  itching x2.  She is obese.  Denies odor.  Denies yellow coloring.   PHYSICAL EXAMINATION:  GENERAL:  On discharge exam, the patient is alert  and oriented x3.  VITAL SIGNS:  Temperature is 97.7, pulse 81, respirations 16, and blood  pressure 115/77.  ABDOMEN:  Soft and nontender with palpation on the incision site.  No  signs of infection.  No redness.  No abnormal discharge.  Well  approximated.  PELVIC:  Vaginal cuff healing well.  No abnormal discharge.  White  discharge seen within the vaginal vault.  Specimen collected and sent to  lab.   ASSESSMENT:  Postoperative visit.   PLAN:  Wet prep sent to lab.  The patient is to continue followup at  [facility] for the nephrostomy tube and the patient prescription given  for Terazol cream at bedtime x7 days and the patient will follow up at  this clinic in 2 weeks.      Sid Falcon, CNM     WM/MEDQ  D:   04/05/2008  T:  04/05/2008  Job:  [id number]

## 2010-09-29 NOTE — Consult Note (Signed)
NAME:  ARY, RUDNICK NO.:  [id number]   MEDICAL RECORD NO.:  [id number]          PATIENT TYPE:  EMS   LOCATION:  ED                           [facility]ACILITY:  [facility]   PHYSICIAN:  Heloise Purpura, MD      DATE OF BIRTH:  06/29/1980   DATE OF CONSULTATION:  04/27/2008  DATE OF DISCHARGE:  04/27/2008                                 CONSULTATION   REASON FOR CONSULTATION:  Abdominal pain.   HISTORY:  Carmen Peters is a 30 year old female with a history of a left distal  ureteral injury during a hysterectomy.  She underwent a delayed repair  on April 18, 2008, by Dr. Retta Diones including a ureteral  reimplantation.  A ureteral stent and Foley catheter were placed at that  time.  She was subsequently discharged home after an uneventful  postoperative course.  She presents to the emergency department today  with increasing pain in her left lower quadrant.  She has had some  nausea although no vomiting.  She has had some mild left flank pain.  She denies any fever.  She states that her Foley catheter has been  draining well. She is seen at the request of Regan Rakers.   PAST MEDICAL HISTORY:  Chronic pelvic pain.   PAST SURGICAL HISTORY:  Cholecystectomy.   MEDICATIONS:  Percocet.   ALLERGIES:  No known drug allergies.   SOCIAL HISTORY:  The patient is married.  She denies tobacco or alcohol  use.   REVIEW OF SYSTEMS:  Positive for nausea and left-sided lower quadrant  pain.  All systems are reviewed and otherwise negative.   PHYSICAL EXAMINATION:  VITAL SIGNS:  Are stable.  CONSTITUTIONAL:  No acute distress.  HEENT:  Normocephalic, atraumatic.  NECK:  Supple without lymphadenopathy.  LUNGS:  Normal respiratory effort and clear.  CARDIOVASCULAR:  Regular rate and rhythm.  ABDOMEN:  Soft and nondistended.  Her Pfannenstiel incision is without  erythema or drainage.  She does have mild to moderate tenderness in her  left lower quadrant.  No abdominal masses are noted.  EXTREMITIES:  No edema.   LABS:  Serum creatinine 0.8.  White blood count 10.8, hemoglobin 11.7.  Urinalysis 3-6 white blood cells, 21-50 red blood cells, few bacteria,  many yeast.  Urine cultures currently pending.   RADIOLOGY IMAGING:  A KUB demonstrates her ureteral stent to be in  appropriate position.  A CT scan of the abdomen and pelvis without  contrast was also obtained in the emergency department and was  independently reviewed.  This demonstrates that her stent is in  relatively good position although the proximal curl may be slightly in  the proximal ureter or at the UPJ.  However, there is not significant  left hydronephrosis.  She does have an enlarged left ovary which is not  very well imaged on this nonenhanced scan but appears unchanged from her  prior CT.  There was no evidence of obvious abscess or free air.   IMPRESSION:  Abdominal pain likely related to indwelling ureteral stent.   PLAN:  She was given a higher dose of Percocet to go home with.  She  has  been instructed to keep her appointment with Dr. Retta Diones in 2 days for  catheter removal.  She also will be given a prescription to begin  fluconazole considering her yeast on her urine culture today prior to  catheter removal on Monday.      Heloise Purpura, MD  Electronically Signed     LB/MEDQ  D:  04/27/2008  T:  04/27/2008  Job:  [id number]   cc:   Bertram Millard. Dahlstedt, M.D.  Fax: [phone]

## 2010-09-29 NOTE — Group Therapy Note (Signed)
NAME:  Carmen Peters, Carmen Peters NO.:  [id number]   MEDICAL RECORD NO.:  [id number]          PATIENT TYPE:  WOC   LOCATION:  [facility]                   FACILITY:  [facility]   PHYSICIAN:  Johnella Moloney, MD        DATE OF BIRTH:  21-Apr-1981   DATE OF SERVICE:                                  CLINIC NOTE   The patient is a 30 year old gravida 2, para 2, status post hysterectomy  and right salpingo-oophorectomy on March 04, 2008, complicated by left  ureteral obstruction.  The patient underwent a nephrostomy tube  placement and antegrade stent placement was unsuccessful, so she  underwent a left ureteral reimplantation on April 18, 2008.  After her  surgery, patient's nephrostomy tube was removed.  She has been seen in  the [facility] emergency room on April 27, 2008 and April 30, 2008, for complaints of left flank pain and also of constipation.  She  had a imaging during this visit which showed no acute postsurgical  abnormality or other concerns.  The patient is here today for followup.  She is saying that her pain is not controlled on the Percocet.  Of note,  she has prescribed 7.5/325 mg as needed for pain.  She is also  complaining of some nausea which is brought on by having a burning  sensation in her chest.  She does not have any other symptoms.   PHYSICAL EXAMINATION:  GENERAL:  The patient is in no acute distress.  She is afebrile.  VITAL SIGNS:  Stable.  SKIN:  Her incisions are well healing.  No erythema, exudate, or  fluctuance palpated.  Also, her nephrostomy site incision is also well  healed.  PELVIC:  Deferred at this visit.   IMPRESSION:  The patient is a 30 year old, gravida 2, para 2, here for  postoperative evaluation after complicated postoperative course.  She is  complaining of some pain in her left lower quadrant and left flank area.  The patient was told that this area has undergone a lot of procedures  and she is probably having a lot of  inflammatory pain.  She is on  Percocet, but that does not do anything for the inflammation, so the  patient will be given a prescription for ibuprofen 600 mg p.o. q.6 h. to  take as needed for her pain.  As for her burning sensation in her chest  prior to the emesis, the patient was advised that this could be a sign  of reflux and she will be given a prescription for Pepcid 20 mg p.o.  b.i.d. just to help with this symptom and see if this could ameliorate  her emesis.  Of note, she does have a prescription for Phenergan already  which she said helps with her emesis.  Of note, this will only happens  in the mornings and she is able to tolerate food for the rest of the  day.  She was told to return to the hospital, if she is unable to  tolerate food, liquid, or any other worsening symptoms.  The patient  will be seen back for reevaluation in the next 2-3 weeks.  ______________________________  Johnella Moloney, MD     UD/MEDQ  D:  05/03/2008  T:  05/04/2008  Job:  [id number]

## 2010-09-29 NOTE — Group Therapy Note (Signed)
NAME:  Carmen Peters, SCHLEE NO.:  [id number]   MEDICAL RECORD NO.:  [id number]          PATIENT TYPE:  WOC   LOCATION:  [facility]                   FACILITY:  [facility]   PHYSICIAN:  Argentina Donovan, MD        DATE OF BIRTH:  20-Feb-1981   DATE OF SERVICE:                                  CLINIC NOTE   The patient is a 30 year old Hispanic Spanish-speaking female who  underwent total abdominal hysterectomy and right salpingo-oophorectomy  in March 04, 2008.  She was followed by a __________ vesicular  reimplantation with an ureteral stent and percutaneous nephrostomy tube  removal was done successfully on April 18, 2008.  She is seen today  for routine follow-up.  Three days ago she had her first episode  __________.  She tried intercourse for the first time and had some small  amount of bleeding after that and spotting the following day.   PHYSICAL EXAMINATION:  On examination the abdomen soft, flat, nontender.  No masses or organomegaly.  The surgical scars well healed.  External  genitalia is normal.  BUS is within normal limits.  Vagina is clean and  well rugated.  The cuff looks completely normal with the exception of  one tiny several millimeter area of, what looks like, granulation tissue  at the left apex.  This was treated with silver nitrate.  I told the  patient that that will probably take care of it if it does not come  back.  She had no pain from the procedure.  It told her to wait several  days before she tries intercourse again.   IMPRESSION:  Vaginal bleeding secondary to a small area are of  granulation postop.  Dr. from pros and           ______________________________  Argentina Donovan, MD     PR/MEDQ  D:  06/05/2008  T:  06/05/2008  Job:  [id number]

## 2010-09-29 NOTE — Discharge Summary (Signed)
NAMEMarland Peters  [name], VENT        ACCOUNT NO.:  [id number]   MEDICAL RECORD NO.:  [id number]          PATIENT TYPE:  INP   LOCATION:  9304                          [facility]AC[facility]   PHYSICIAN:  Norton Blizzard, MD    DATE OF BIRTH:  09-04-1980   DATE OF ADMISSION:  03/11/2008  DATE OF DISCHARGE:  03/14/2008                               DISCHARGE SUMMARY   ADMISSION DIAGNOSIS:  Left ureteral obstruction after total abdominal  hysterectomy and right salpingo-oophorectomy that was done on March 04, 2008.   DISCHARGE DIAGNOSIS:  Status post left percutaneous nephrostomy  placement for alleviation of the ureteral obstruction.   PROCEDURE:  Left percutaneous nephrostomy tube placement on March 12, 2008.   CONSULTS:  Interventional Radiology -  Dr. Fredia Sorrow  Urology - Dr. Bertram Millard. Dahlstedt   PERTINENT STUDIES:  A CT scan of the pelvis done on admission showed  bladder within normal limits.  The left ureter is distended throughout  its course denoting severe hydronephrosis, and there is no contrast in  the left ureter.  This suggests a high-grade obstruction in the distal  left ureter.  Admission creatinine was 1.01.  Followup creatinine after  the percutaneous nephrostomy tube placement was 0.8.   BRIEF HOSPITAL COURSE:  The patient is a 30 year old gravida 2, para 2  Hispanic female who underwent a total abdominal hysterectomy and  right  salpingo-oophorectomy for a history of chronic pelvic pain and a pelvic  mass on March 04, 2008.  For further details of this operation, please  refer to the separate dictated operative report.  The patient had an  uncomplicated initial postoperative course and was discharged to home on  March 06, 2008.  However, she represented to the [facility] on March 11, 2008, with diffuse abdominal pain.  Subsequent workup revealed a left  ureteral obstruction as per the aforementioned CT scan findings.  The  patient was admitted for management.   Urology was consulted and  Interventional Radiology was also consulted and their recommendation was  for left percutaneous nephrostomy tube placement given that the patient  was noted to have severe left hydronephrosis on CT scan.  She underwent  this procedure on March 12, 2008.  For further details, please refer  to the separate dictated operative report.  She was placed on  Ciprofloxacin for urinary tract infection prophylaxis, and she had no  signs and symptoms of infection during her admission.  By postoperative  day #2, her pain was well controlled on oral pain medications.  She was  tolerating regular diet and having no other symptoms.  She was deemed  stable for discharge to home.   DISCHARGE MEDICATIONS:  The patient was advised to continue her  Percocet, Ibuprofen, and Colace as previously prescribed.  She will also  be on Ciprofloxacin 500 mg p.o. b.i.d. for urinary tract infection  prophylaxis for as long as the percutaneous nephrostomy tube is in  place.   DISCHARGE INSTRUCTIONS:  The patient was given instructions to return to  the emergency room for any fevers or any other postoperative concerns.   FOLLOWUP APPOINTMENT:  The patient  has a scheduled attempt at placing an  anterograde left ureteral stent on Monday, March 18, 2008, at 8:30  a.m. at [facility].  Instructions for this were given to the  patient by the Interventional Radiology staff.  The patient was told  that if this was not successful, the percutaneous  nephrostomy tube will be kept in place and another attempt will be done  at a later time or Urology will be consulted for possible ureteral  reimplantation.  The patient does verbalize understanding of plan.  The  patient also has a followup appointment in the GYN Clinic that is  scheduled on April 04, 2008, at 12:45 p.m.      Norton Blizzard, MD  Electronically Signed     UAD/MEDQ  D:  03/14/2008  T:  03/15/2008  Job:  [id number]

## 2010-09-29 NOTE — Group Therapy Note (Signed)
NAME:  Carmen Peters, Carmen Peters NO.:  [id number]   MEDICAL RECORD NO.:  [id number]          PATIENT TYPE:  WOC   LOCATION:  [facility]                   FACILITY:  [facility]   PHYSICIAN:  Argentina Donovan, MD        DATE OF BIRTH:  08/09/1980   DATE OF SERVICE:  02/14/2008                                  CLINIC NOTE   The patient is a 30 year old Hispanic female, gravida 2, para 2-0-0-2,  with two vaginal deliveries who has had dyspareunia she has had always,  but it has gotten much worse in the last year.  Her youngest child is 34  years old.  She was at [facility] and referred by them after her  getting an ultrasound, which showed what appeared to be a normal-sized  uterus with a pelvic mass to the right in its posterior portion that was  almost similar in size to the size of the uterus.  She is exquisitely  tender on examination, and I cannot understand why she has dyspareunia.  She says that this is what it has been feeling like for a year, although  it has been getting worse.  Uterus is markedly irregular.  There is mass  in the cul-de-sac.  We talked to her about myomectomy.  If this is just  a fibroid, sometimes we are unable to control bleeding and for other  reasons it is only practical we have to end up doing a hysterectomy.  She understands that and she also understands that the ovaries might  sometimes have to be removed if we find that the disease affects of all  of this.  There is a question of a paraovarian mass that might be  masking as a fibroid there; however, on examination it is markedly  irregular and I think that probably the mass is mainly in the uterus.  The patient has been fairly healthy.   She had a gallbladder removed some time ago, but other than that no  surgery.   She takes no medication.   She has no medical allergies, and review of systems is relatively  negative outside of that.   We are going to have her come in for her presurgery  consultation after  getting an MRI and see if we can get anymore information at what this  mass may be.   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   The plan is exploratory laparotomy with possible myomectomy, possible  abdominal hysterectomy, and possible oophorectomy.            ______________________________  Argentina Donovan, MD     PR/MEDQ  D:  02/14/2008  T:  02/15/2008  Job:  [id number]

## 2010-09-29 NOTE — Op Note (Signed)
NAME:  Carmen Peters, Carmen Peters        ACCOUNT NO.:  [id number]   MEDICAL RECORD NO.:  [id number]          PATIENT TYPE:  AMB   LOCATION:  DAY                          FACILITY:  [facility]   PHYSICIAN:  Bertram Millard. Dahlstedt, M.D.DATE OF BIRTH:  Mar 18, 1981   DATE OF PROCEDURE:  12/13/2008  DATE OF DISCHARGE:                               OPERATIVE REPORT   PREOPERATIVE DIAGNOSIS:  Bladder calculus.   POSTOPERATIVE DIAGNOSIS:  Bladder calculus.   PROCEDURE:  Cystoscopy, laser lithotripsy of bladder calculus.   SURGEON:  Bertram Millard. Dahlstedt, M.D.   ANESTHESIA:  General with LMA.   COMPLICATIONS:  None.   BRIEF HISTORY/INDICATIONS:  This 30 year old female underwent a  complicated hysterectomy last year.  I ended up dealing with a ligated  left ureter.  This eventually needed re-implantation.  She has done well  since that time in followup until recently, when I saw her with a large  bladder calculus.  It was recommended that she have this taken care of  under anesthesia with laser.  The risks and complications of the  procedure have been discussed with the patient.  She understands these  and desires to proceed.   DESCRIPTION OF PROCEDURE:  The patient was administered preoperative IV  antibiotics and was taken to the operating room where general anesthetic  was administered and using LMA.  She was placed in the dorsal lithotomy  position.  The genitalia and perineum were prepped and draped.  A time-  out was then called.   The procedure then commenced.  A resectoscope sheath was then placed in  the patient's bladder.  Using the 0-degree lens, and the 1000 micron  laser fiber, I fragmented the stone into multiple smaller pieces.  I  used the lithotrite, and passed through a separate cystoscope, to  fragment the larger pieces, once the big stone had been taking care of.  These were all irrigated with copious amounts of saline.  Re-inspection  several times revealed some fragments  remaining.  These again were  crushed with a lithotrite and extracted.  Finally, a final look revealed  no evident stones.  The bladder wall was somewhat red at this point, due  to the trauma from the lithotripsy.  Once all fragments were irrigated,  the bladder was drained with an 18-French Foley catheter, which was  hooked to dependent drainage.   The patient tolerated the procedure well and was awakened and taken to  the PACU in stable condition.      Bertram Millard. Dahlstedt, M.D.  Electronically Signed    SMD/MEDQ  D:  12/13/2008  T:  12/13/2008  Job:  [id number]

## 2010-09-29 NOTE — Consult Note (Signed)
NAME:  Carmen Peters, Carmen Peters        ACCOUNT NO.:  [id number]   MEDICAL RECORD NO.:  [id number]          PATIENT TYPE:  AMB   LOCATION:  XRAY                         FACILITY:  [facility]   PHYSICIAN:  Norton Blizzard, MD    DATE OF BIRTH:  01-22-1981   DATE OF CONSULTATION:  DATE OF DISCHARGE:                                 CONSULTATION   TELEPHONE ENCOUNTER NOTE:  The patient is a 30 year old, gravida 2, para  2, who was readmitted on March 11, 2008, for a left ureteral  obstruction sustained after a total abdominal hysterectomy that was  performed on March 04, 2008.  A CT scan on admission showed a high-  grade obstruction in the distal left ureter.  The patient underwent a  percutaneous nephrostomy tube placement during this hospitalization and  was discharged to home on March 14, 2008 with home health services.  She was seen by an interventional radiologist who placed percutaneous  nephrostomy tube and also by a urology consult.  The plan was made for  an antegrade ureteral stent placement attempt to be done today on  March 18, 2008.  The patient did go for this procedure at 8:30 a.m.  today, and on evaluation of the operative report it is noted that this  attempt was unsuccessful, and the nephrostomy tube was replaced and left  to gravity drainage.  The patient is now scheduled to return on April 04, 2008, for a reattempt of this stent placement.  The patient was  called at home.  She was told that it is unfortunate that stent  placement did not work, and she was told that we will continue to follow  along and hope for the best during her second attempt at stent  placement.  The patient does understand that if the second attempt in 2  weeks does not work that she will possibly need to undergo ureteral  reimplantation which would involve open surgery with the urologist.  All  questions were answered for the patient.  Of note, all of this encounter  was done over the phone  with the help of a Spanish interpreter.  The  patient is in good spirits.  Her pain is well controlled.  She does  understand the reason these interventions are necessary for this  complication of surgery, and she does say that she will follow up in the  GYN Clinic on April 05, 2008, after her second ureteral stent  attempt.  The patient was told to call the clinic or come to the  emergency room here at [facility] if she has increased pain,  fevers or any other problems with her percutaneous nephrostomy tube or  any other postoperative concerns.  The patient is getting a home health  nurse to help with dressing changes of the nephrostomy tube and also for  education of the patient and her family members regarding the care of  the percutaneous nephrostomy tube.  The Spanish interpreter that helped  during this telephone encounter was [facility].      Norton Blizzard, MD  Electronically Signed     UAD/MEDQ  D:  03/18/2008  T:  03/19/2008  Job:  [id number]

## 2010-09-29 NOTE — Op Note (Signed)
NAME:  Carmen Peters, Carmen Peters        ACCOUNT NO.:  [id number]   MEDICAL RECORD NO.:  [id number]          PATIENT TYPE:  INP   LOCATION:  9317                          [facility]ILITY[facility]   PHYSICIAN:  Norton Blizzard, MD    DATE OF BIRTH:  1980/08/20   DATE OF PROCEDURE:  03/04/2008  DATE OF DISCHARGE:                               OPERATIVE REPORT   PREOPERATIVE DIAGNOSES:  Chronic pelvic pain, pelvic mass, and undesired  fertility.   POSTOPERATIVE DIAGNOSES:  Chronic pelvic pain, pelvic mass, undesired  fertility, and benign solid ovarian mass on frozen section.   PROCEDURES:  Total abdominal hysterectomy and right salpingo-  oophorectomy.   SURGEON:  Norton Blizzard, MD.   ASSISTANTMichele Mcalpine D. Rose, MD.   ANESTHESIA:  General.   INDICATIONS:  The patient is a 30 year old Hispanic gravida 2, para 2-0-  2-2 with a history of chronic pelvic pain and worsening dyspareunia over  the last year.  She underwent an ultrasound which showed a right-sided  pelvic mass and the patient was noted to be exclusively tender on  examination.  She also underwent an MRI, which showed a 9-cm anterior  pedunculated mass, which the initial read was of a pedunculated fibroid.  Several intramural fibroids were also noted less than 1 cm, and normal  ovaries were visualized.  The patient was counseled regarding surgery as  she desired definitive surgical management.  She was interested in a  hysterectomy because of chronic pelvic pain and the pelvic mass.  The  patient was told that there is a possibility that if this is a  pedunculated fibroid that it could be easily removed, and her uterus  kept in place, but the patient did not want this option and wanted a  hysterectomy as she does not desire any future fertility.  The risks of  surgery including bleeding, infection, injury to surrounding organs, and  need for additional procedures were discussed with the patient, and  written informed consent was  obtained with the aide of a Spanish  interpreter.  Of note, the patient did sign the hysterectomy statement  and also signed the sterilization consent.   FINDINGS:  Enlarged right ovary, solid in appearance, measuring about 10  cm; benign ovarian mass, possible fibroma on frozen section. Normal left  adnexa.  9-10 weeks' size anteverted uterus   SPECIMENS:  Uterus, cervix cervix, right ovary and fallopian tube, and  some paraovarian and paratubal cysts removed from the right adnexa.  These specimens were sent to Pathology.   ESTIMATED BLOOD LOSS:  300 mL.   IV FLUIDS:  1400 mL of lactated Ringer's.   URINE OUTPUT:  100 mL.   COMPLICATIONS:  None.   PROCEDURE IN DETAIL:  The patient received preoperative IV antibiotics  approximately 30 minutes prior to surgery.  She was taken to the  operating room and given adequate general anesthesia.  Compression boots  were applied to lower extremities, and the patient was placed in a  supine position.  The abdomen and perineum were prepped and draped in a  usual manner, and a Foley catheter was inserted into the bladder  and  attached to constant drainage.  A Pfannenstiel transverse incision was  made 2 fingerbreadths above the pubic symphysis.  This incision was  taken down to the layer of fascia using an electrocautery, and the  fascial incision was extended bilaterally using electrocautery without  difficulty.  The fascia was then dissected off the underlying rectus  muscles using blunt and sharp dissection.  The rectus muscle was split  bluntly in the midline, and peritoneum was entered sharply without  complication.  This peritoneal incision was extended superiorly and  inferiorly with care given to prevent bowel or bladder injury.  Upon  entry into the abdominal cavity, the upper abdomen was palpated and  found to be normal.  Attention was then turned to the pelvis where the  enlarged solid right ovary was noted.  At this point, the  decision was  made to proceed with a right salpingo-oophorectomy.  A hole was created  in the clear portion of the broad ligament, and the adnexa on the right  side were clamped.  This pedicle was clamped, cut, and doubly suture  ligated with good hemostasis.  The procedure was also carried out on the  right infundibulopelvic ligament, which was clamped, cut, and doubly  suture ligated allowing for right salpingo-oophorectomy.  Attention was  then turned to the round ligaments on each side, which were clamped,  transected with electrocautery, and suture ligated allowing entry into  the broad ligament on both sides.  The anterior and posterior leaves of  the broad ligament were separated, and the ureters were palpated to be  safely away from the area of dissection.  A bladder flap was then  created across the anterior leaf of the broad ligament, and the bladder  was bluntly dissected off the lower uterine segment and cervix with good  hemostasis.  A hole was then created in a clear portion of the posterior  broad ligament on the left side, and the adnexa were clamped on the  right side.  This pedicle was cut and doubly suture ligated with good  hemostasis, leaving the left ovary and fallopian tube in place.  The  broad ligament was then clamped, cut, and ligated bilaterally.  The  uterine arteries were then skeletonized bilaterally and then clamped,  cut, and ligated with care given to prevent ureteral injury.  The  uterosacral ligaments were clamped, cut, and ligated bilaterally.  Finally, the cardinal ligaments were clamped, cut, and ligated  bilaterally.  Acutely, curved clamps were placed across the vagina just  under the cervix.  This specimen was amputated and sent to pathology.  Of note, the ovarian mass was also sent for frozen section, and the  frozen section read came back as benign.  The vaginal cuff was closed  with a series of interrupted 0 Vicryl figure-of-eight sutures with  care  given to incorporate the anterior pubocervical fascia and the posterior  rectovaginal fascia.  The vaginal cuff angles were noted to have good  hemostasis.  The pelvis was irrigated, and hemostasis was reconfirmed in  all pedicles, the vaginal cuff, and along the pelvic sidewall.  The  rectus muscles and peritoneum were reapproximated in the midline with  interrupted 0 Vicryl sutures.  The fascia was then closed in an end-to-  end fashion with a running continuous 0 PDS suture.  The subcutaneous  tissues were then irrigated, and hemostasis was confirmed.  The skin was  closed with 4-0 Vicryl subcuticular stitch with good cosmetic result,  and Steri-Strips were also placed on the incision.  The patient  tolerated the procedure well.  Sponge, instrument, and needle counts  were correct x3.  She was taken to the recovery room, awake, extubated,  and in stable condition.      Norton Blizzard, MD  Electronically Signed     UAD/MEDQ  D:  03/04/2008  T:  03/05/2008  Job:  [id number]

## 2010-09-29 NOTE — Consult Note (Signed)
NAME:  Carmen Peters, Carmen Peters        ACCOUNT NO.:  [id number]   MEDICAL RECORD NO.:  [id number]           PATIENT TYPE:   LOCATION:                                 FACILITY:   PHYSICIAN:  Bertram Millard. Dahlstedt, M.D.DATE OF BIRTH:  13-Dec-1980   DATE OF CONSULTATION:  DATE OF DISCHARGE:                                 CONSULTATION   REASON FOR CONSULTATION:  Left hydronephrosis.   BRIEF HISTORY:  This 30 year old Latino female is 1-week out from total  abdominal hysterectomy with right salpingo-oophorectomy for chronic  pelvic pain.  She also has an ovarian mass.  About 2 days ago, she began  having left lower quadrant pain radiating into her left flank.  This was  associated with nausea and vomiting.  The patient had low grade fever.  She did not have dysuria or gross hematuria.  She was evaluated in the  [facility], and found to have, on CT scan, left hydronephrosis.  Urologic consultation is requested.   At time of consultation, Interventional Radiology had already been  consulted for possible percutaneous nephrostomy tube placement.   Her past medical history is significant for chronic pelvic pain.  She  also has had a cholecystectomy for cholelithiasis.   Home medications included ibuprofen, Colace, and Percocet.   There are no known drug allergies.   The patient is married.  She is a nonsmoker, does not drink.   REVIEW OF SYSTEMS:  Significant for low grade fever.  There is left  flank pain/abdominal pain.  She has had nausea and vomiting.  She has  had constipation.  Last bowel movement was 2 days ago.  She has had  headaches for a couple of days.   PHYSICAL EXAMINATION:  GENERAL:  A pleasant adult female.  She was in no  acute distress.  HEENT:  Normal.  NECK:  Supple without megaly or adenopathy.  CHEST:  Clear.  HEART:  Normal rate and rhythm.  ABDOMEN:  Soft, flat with minimal left upper quadrant tenderness.  Incision site was clear, clean, dry, and intact.   Percutaneous tube was  present in her left flank, draining clear urine.  She had normal  external genitalia without lesions.   CT scan with IV contrast was reviewed.  The patient has no significant  excretion on the left side.  There is obvious left hydroureter, down to  the mid bladder region on the left.  Laboratories were  evaluated/reviewed.   IMPRESSION:  A left ureteral obstruction secondary to operative injury.  Currently, her kidney is being properly drained with percutaneous  nephrostomy tube.   RECOMMENDATIONS:  1. I would recommend we leave the tube in for few more days.  Dr.      Fredia Sorrow, or another interventional radiologist, can try to      navigate a wire and then a stent by this obstruction, if possible.  2. If this is impossible, she will eventually need      ureteroneocystostomy.  I would not do that for another 2-3 weeks      however.  3. We will follow with you.      Jeannett Senior  Elza Rafter, M.D.  Electronically Signed     SMD/MEDQ  D:  03/12/2008  T:  03/13/2008  Job:  [id number]

## 2010-09-29 NOTE — Op Note (Signed)
NAME:  Carmen Peters, Carmen Peters        ACCOUNT NO.:  [id number]   MEDICAL RECORD NO.:  [id number]          PATIENT TYPE:  INP   LOCATION:  1444                         [facility]ACILITY[facility]   PHYSICIAN:  Bertram Millard. Dahlstedt, M.D.DATE OF BIRTH:  [birth year]   DATE OF PROCEDURE:  DATE OF DISCHARGE:                               OPERATIVE REPORT   ATTENDING PHYSICIAN:  Bertram Millard. Dahlstedt, M.D.   ASSISTANT:  1. Dr. Duane Boston.  2. Dr. Delia Chimes.   PREOPERATIVE DIAGNOSIS:  Left ureteral obstruction/left distal ureteral  injury.   POSTOPERATIVE DIAGNOSIS:  Left ureteral obstruction/left distal ureteral  injury.   PROCEDURES:  1. Left ureteral extravesical reimplantation.  2. Left ureteral stent placement.  3. Left percutaneous nephrostomy tube removal.   INDICATIONS:  This is a 30 year old female with a history of  hysterectomy complicated by a left distal ureteral injury.  After an  adequate time had passed from her initial surgery the risks and benefits  were discussed with the patient and she elected to proceed with left  ureteral reimplantation.  Recent benefits of the procedure as well as  alternatives were discussed with the patient.  Please note at the time  of diagnosis a left percutaneous nephrostomy tube had been placed.   PROCEDURE IN DETAIL:  The patient was brought back to the operating room  and after the successful induction of general endotracheal anesthetic  she was placed in dorsal lithotomy position and all pressure points were  padded appropriately.  She was prepped and draped in the usual sterile  fashion.  A preoperative time-out was performed.  She received  preoperative antibiotics.   We incised her Pfannenstiel incision and used cautery to dissect down to  the external oblique fascia.  We opened the anterior rectus fascia at  this point, identified the rectus muscle.  We dissected at the midline  and entered the peritoneal cavity.  At this point the  Bookwalter  retractor was used to retract the rectus muscles laterally, the cephalad  portion of the wound cephalad and the lower portion of the wound down  toward the pubis.  At this point we used sharp dissection to free up  adhesions of the sigmoid colon.  Once this was done we were able to  identify the posterior peritoneal fold overlying the ureter.  Once we  entered that we easily identified the ureter proximally to where it was  obstructed.  At this point we traced the ureter superiorly about 3 cm  and proceeded inferiorly until we met dense scar.  At this point the  ureter was taken and separated.  We spatulated the lower portion of the  ureter and proceeded by mobilizing and dissecting the bladder.  An area  over the left lateral wall was exposed and we dissected through detrusor  to identify the urothelium.  We then incised the urothelium  approximately 1 cm to plug in the spatulated ureter.  Then using  interrupted 4-0 PDS suture we reanastomosed the ureter to the bladder.  Prior to complete anastomosis a ureteral stent was placed.  At this  point the detrusor was reapproximated over the ureter  creating a 1-2 cm  tunnel.  At this point the bladder was decompressed and we reinspected  the anastomosis and there appeared to be no undue tension on the  anastomosis.  We irrigated the wound, identified no visible bleeding and  proceeded with closing.  A drain was placed into the right flank.  The  fascia was closed with zero PDS and the skin was reapproximated with  staples.  Sterile dressing was placed and finally the nephrostomy tube  was removed.  At this point the procedure was ended.   Please note Dr. Retta Diones was the responsible surgeon and present  throughout the entirety of the case.   ESTIMATED BLOOD LOSS:  150 mL.   URINE OUTPUT:  Not recorded.   DRAINS:  1. Foley catheter.  2. Left ureteral stent.     ______________________________  Dr Ria Bush. Dahlstedt, M.D.  Electronically Signed    BP/MEDQ  D:  04/18/2008  T:  04/18/2008  Job:  [id number]

## 2010-09-30 ENCOUNTER — Emergency Department (HOSPITAL_COMMUNITY)
Admission: EM | Admit: 2010-09-30 | Discharge: 2010-09-30 | Disposition: A | Discharge status: Home / Self Care | Payer: Self-pay | Attending: Emergency Medicine | Admitting: Emergency Medicine

## 2010-09-30 DIAGNOSIS — B3731 Acute candidiasis of vulva and vagina: Secondary | ICD-10-CM | POA: Insufficient documentation

## 2010-09-30 DIAGNOSIS — B373 Candidiasis of vulva and vagina: Secondary | ICD-10-CM | POA: Insufficient documentation

## 2010-10-02 NOTE — Op Note (Signed)
Carmen Peters, Carmen Peters        ACCOUNT NO.:  [id number]   MEDICAL RECORD NO.:  [id number]          PATIENT TYPE:  INP   LOCATION:  6711                         FACILITY:  [facility]   PHYSICIAN:  Leonie Man, M.D.   DATE OF BIRTH:  Jun 02, 1980   DATE OF PROCEDURE:  05/25/2005  DATE OF DISCHARGE:                                 OPERATIVE REPORT   PREOPERATIVE DIAGNOSIS:  Subacute cholecystitis, cholelithiasis.   POSTOPERATIVE DIAGNOSIS:  Subacute cholecystitis, cholelithiasis.   PROCEDURE:  Laparoscopic cholecystectomy with intraoperative cholangiogram.   SURGEON:  Leonie Man, M.D.   ASSISTANT:  Dr. Violeta Gelinas.   ANESTHESIA:  General.   SPECIMENS SENT TO LAB:  Gallbladder with stones.   ESTIMATED BLOOD LOSS:  Minimal.   COMPLICATIONS:  None.   The patient was returned to the PACU in excellent condition.   INDICATIONS:  [name] is a 30 year old non-English-speaking Hispanic  female with recurrent episodes of right upper quadrant pain associated with  food intake. She was admitted acutely to the hospital for increasing pain,  nausea and vomiting.  Gallbladder ultrasound demonstrates cholelithiasis  with some of the stigmata of acute cholecystitis. Liver function studies are  within normal limits and her lipase levels were normal.   The patient is prepared and brought to the operating room after the risks  and potential benefits of gallbladder surgery were discussed with her with  the assistance of an interpreter. The patient indicates understanding and  gives her consent.   PROCEDURE:  Following the induction of satisfactory general anesthesia with  the patient positioned supinely, the abdomen is prepped and draped to be  included in a sterile operative field. Open laparotomy created through the  umbilicus with insertion of a Hassan cannula and insufflation of the  peritoneal cavity to 14 mmHg pressure using carbon dioxide. The camera is  inserted.  The visual  exploration carried out.  All of the viscera viewed  appeared to be normal with the exception of the gallbladder which was  chronically scarred.  The abdomen was further accessed through an epigastric  and two laterally placed ports and the gallbladder was grasped and retracted  cephalad. Dissection carried down in the region of the hepatoduodenal  ligament starting at the ampulla of the gallbladder and isolating the cystic  artery and cystic duct, cystic artery being traced up to the gallbladder  wall and the cystic duct traced to the gallbladder cystic duct junction. The  cystic artery was doubly clipped and transected.  The cystic duct was  clipped proximally and opened and the cystic duct cholangiogram was carried  out by passing a Cook catheter into the abdomen and into the cystic duct and  injecting one half Renografin into the extrahepatic biliary system under  fluoroscopic guidance. The resulting cholangiogram showed normal caliber  extrahepatic ducts with a prompt flow of the contrast into the duodenum,  normal tapering off the distal common bile duct. The cholangiocatheter was  then removed and the cystic duct was triply clipped and transected. The  gallbladder was then dissected free from the liver bed using electrocautery  and maintaining hemostasis throughout the entire course of  the dissection.  At the end of dissection, the gallbladder was placed in an EndoCatch.  All  areas of dissection checked for hemostasis and additional bleeding points  treated with electrocautery. The right upper quadrant was then thoroughly  irrigated with multiple aliquots of normal saline and aspirated. The  gallbladder was then retrieved through the umbilical port without  difficulty. Sponge, instrument and sharp counts were verified. The lateral  trocars removed under direct vision.  The pneumoperitoneum allowed to  deflate and the wounds closed in layers as follows. The umbilical wound  closed in  two layers with 0 Vicryl and 4-0 Monocryl, epigastric and lateral  flank wounds closed with 4-0 Monocryl sutures. All incisions were reinforced  with Steri-Strips. Sterile dressings were applied. The anesthetic reversed  and the patient removed from the operating room to the PACU.      Leonie Man, M.D.  Electronically Signed     PB/MEDQ  D:  05/25/2005  T:  05/25/2005  Job:  [id number]

## 2010-10-02 NOTE — H&P (Signed)
NAME:  Carmen Peters, Carmen Peters        ACCOUNT NO.:  [id number]   MEDICAL RECORD NO.:  [id number]          PATIENT TYPE:  INP   LOCATION:  1825                         FACILITY:  [facility]   PHYSICIAN:  Leonie Man, M.D.   DATE OF BIRTH:  1980/08/07   DATE OF ADMISSION:  05/24/2005  DATE OF DISCHARGE:                                HISTORY & PHYSICAL   PRIMARY CARE PHYSICIAN:  None.   CHIEF COMPLAINT:  Right upper quadrant abdominal pain.   HISTORY OF PRESENT ILLNESS:  Carmen Peters is a 30 year old female  patient with no significant past medical history.  She noted right upper  quadrant abdominal pain that was abrupt in onset this morning around 8.  This was associated with nausea, no emesis.  Patient and husband do not  speak English so the translator line was used to assist in the history and  physical examination.  Patient does report similar type pain about three  months ago, but not as severe.  She is currently pain and nausea-free in the  ER.  Pain is only elicited with palpation directly over right upper  quadrant.   REVIEW OF SYSTEMS:  Patient has had no fevers, chills, cough, upper  respiratory symptoms, no reflux, GI symptoms as described above, abdominal  pain as described above.  No dysuria.  No hematuria.  No dark or bloody  stools.  No lower extremity swelling, tachy palpitations, dizziness, or  shortness of breath.   SOCIAL HISTORY:  Again, the patient does not speak [place].  She does not  smoke.  She does not drink alcohol.  She is married.  She says at home.  She  has two children, a girl and a boy.  The son accompanies the husband here  today in the ER.   FAMILY HISTORY:  Her mother has diabetes.  Her sister has hypertension.   PAST MEDICAL HISTORY:  None.   PAST SURGICAL HISTORY:  None including no type of surgery or cesarean  section with childbirth.   ALLERGIES:  NKDA.   CURRENT MEDICATIONS:  None.   PHYSICAL EXAMINATION:  GENERAL:  Pleasant,  mildly sick-appearing female  currently in no acute distress.  VITAL SIGNS:  Temperature 98.7, initial blood pressure 142/95 down to  101/67.  Patient did receive IV fluid and Dilaudid in the ER for resolution  of pain.  NEUROLOGIC:  Patient is alert and oriented x3, moving all extremities x4.  No focal neurologic deficits.  HEENT:  Head is normocephalic.  Sclerae not injected.  NECK:  Supple.  No adenopathy.  CHEST:  Bilateral lung sounds are clear to auscultation anteriorly.  Respiratory effort is nonlabored.  CARDIAC:  S1, S2.  No rubs, murmurs, thrills.  No gallop.  Pulse is regular.  IV fluid is at 100/hour.  ABDOMEN:  Soft, nondistended.  Bowel sounds are present.  She is nontender  except in the right lower quadrant demonstrating positive Murphy's sign.  EXTREMITIES:  Symmetrical in appearance without edema, cyanosis, clubbing.   LABORATORIES:  WBC 7300, hemoglobin 13.2, platelets 187,000.  Urine  pregnancy was negative.  Urinalysis was negative.  Sodium 136, potassium  3.8, CO2  17, BUN 15, creatinine 0.8.  LFTs are normal.  Amylase was not  done.  Lipase 22.   DIAGNOSTICS:  An ultrasound of the abdomen was performed, showed multiple  gallstones and tender gallbladder, small amount of pericholecystic fluid,  otherwise a normal examination.   IMPRESSION:  1.  Acute cholecystitis.  2.  Metabolic acidosis secondary to abdominal pathology.   PLAN:  1.  Admit patient to the surgical floor to Dr. Lurene Shadow.  2.  Continue n.p.o. status, IV fluids D5 one-half normal saline with 20 mEq      potassium at 150/hour for adequate hydration.  3.  Plan OR in the morning laparoscopic cholecystectomy, possible      intraoperative cholangiogram.  4.  Patient will need a translator when Dr. Lurene Shadow discusses the risks and      benefits of the surgery with her and her husband.  5.  Will go ahead and check a CMET, amylase, and lipase in the morning.      Currently no evidence of gallstone  pancreatitis.      Allison L. Rennis Harding, N.P.      Leonie Man, M.D.  Electronically Signed    ALE/MEDQ  D:  05/24/2005  T:  05/24/2005  Job:  [id number]

## 2011-02-05 IMAGING — US US RENAL
1 series · 14 of 25 positions shown · non-contrast
Comparison: 04/29/2008

CLINICAL DATA: Recurrent UTI, hematuria

RENAL/URINARY TRACT ULTRASOUND COMPLETE

[Series 1: us renal · 0.33mm/px · 14 of 36 slices shown]
[im 1/36]
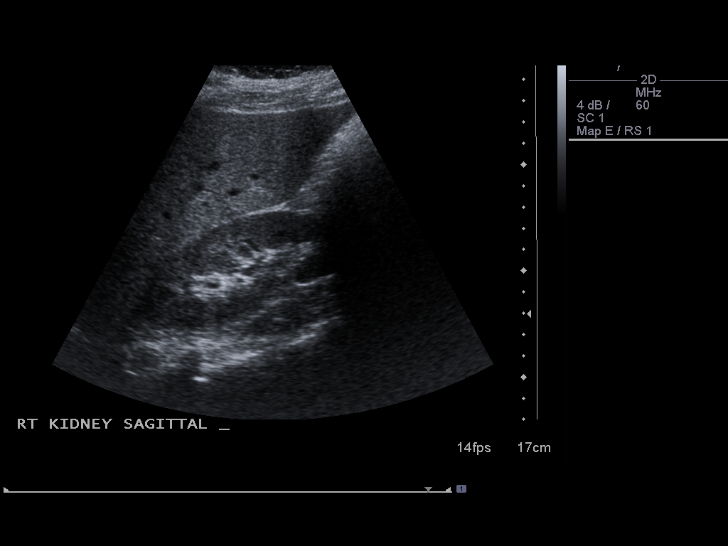
[im 3/36]
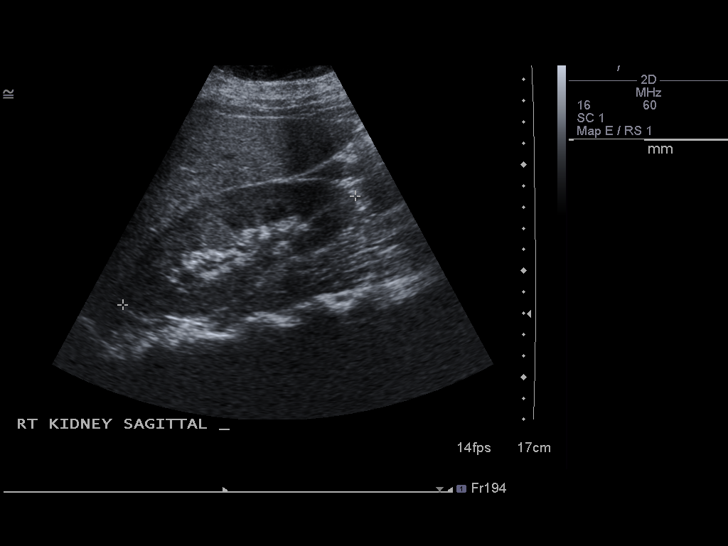
[im 6/36]
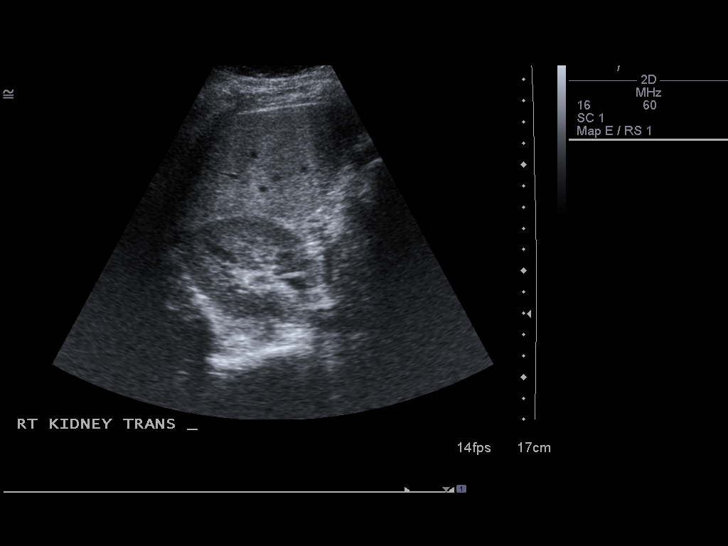
[im 9/36]
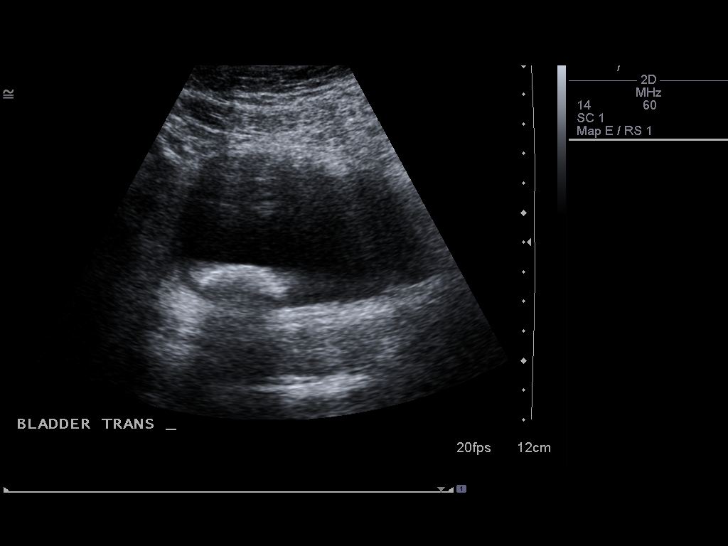
[im 12/36]
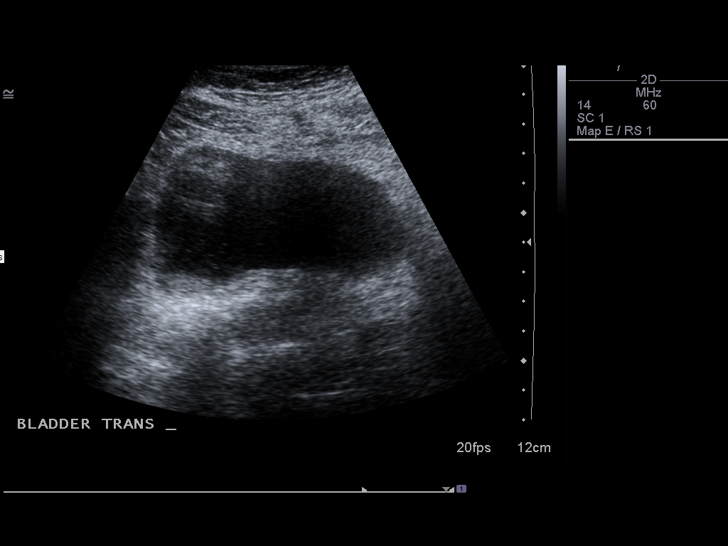
[im 14/36]
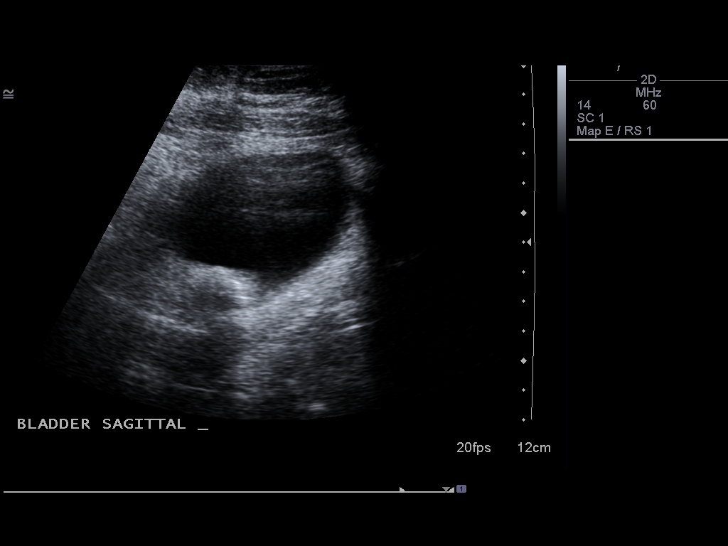
[im 17/36]
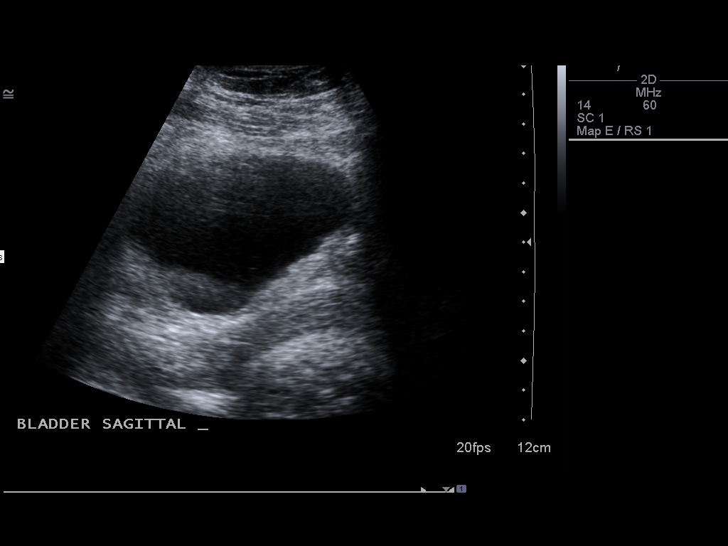
[im 19/36]
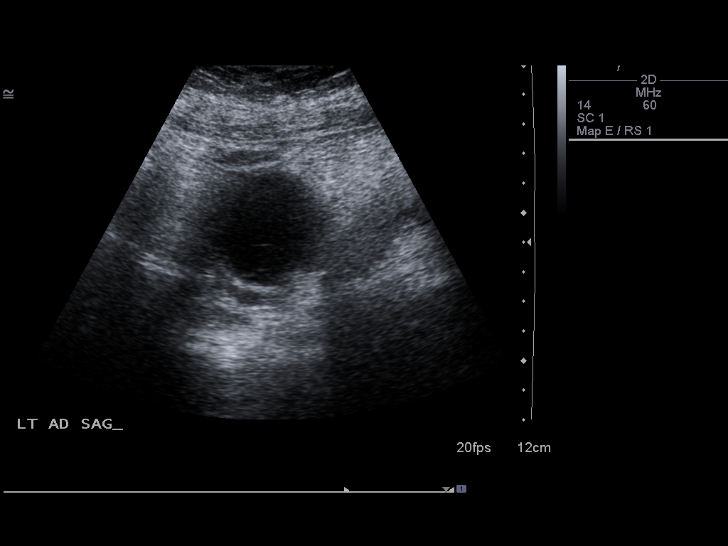
[im 22/36]
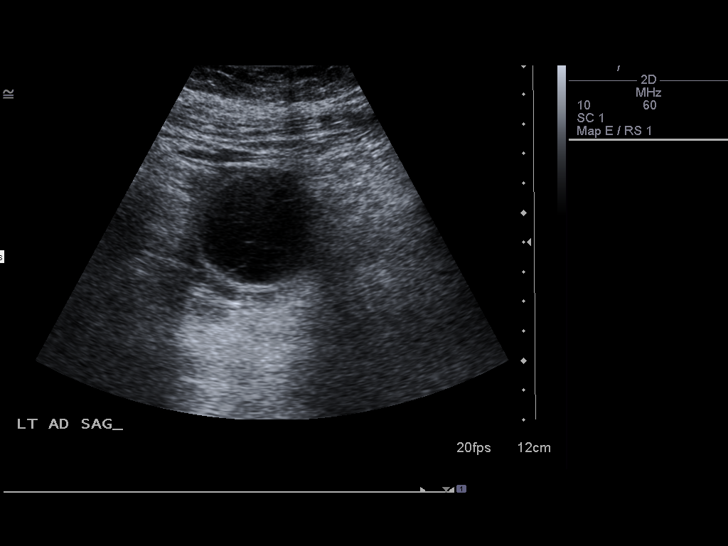
[im 24/36]
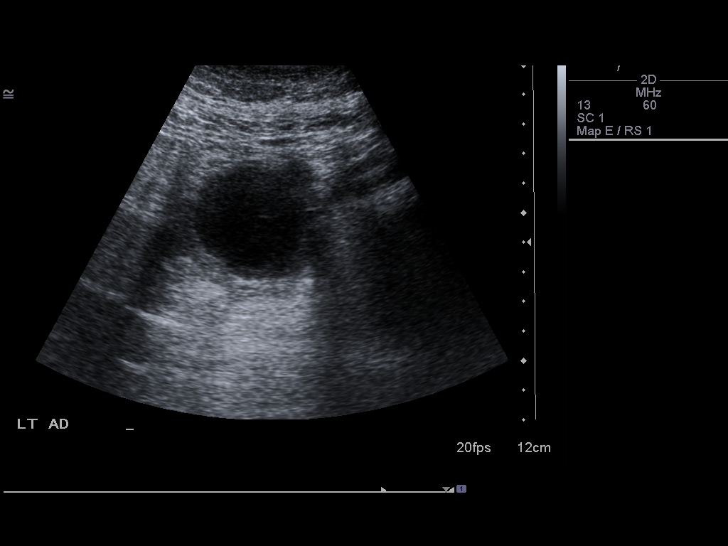
[im 27/36]
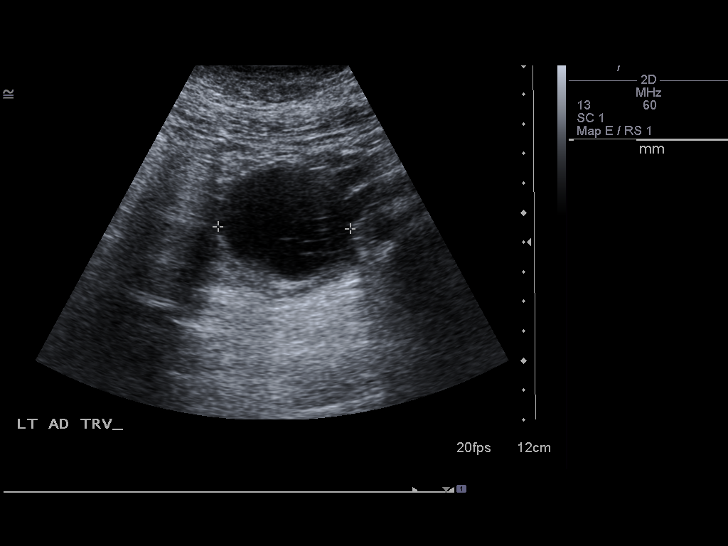
[im 30/36]
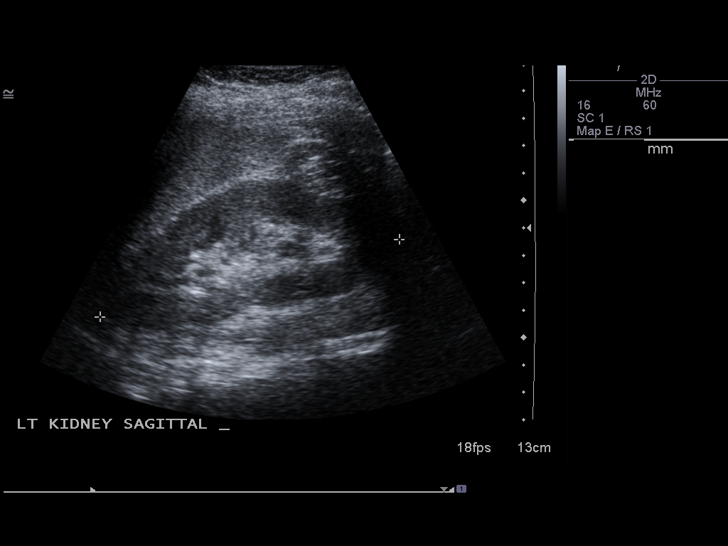
[im 33/36]
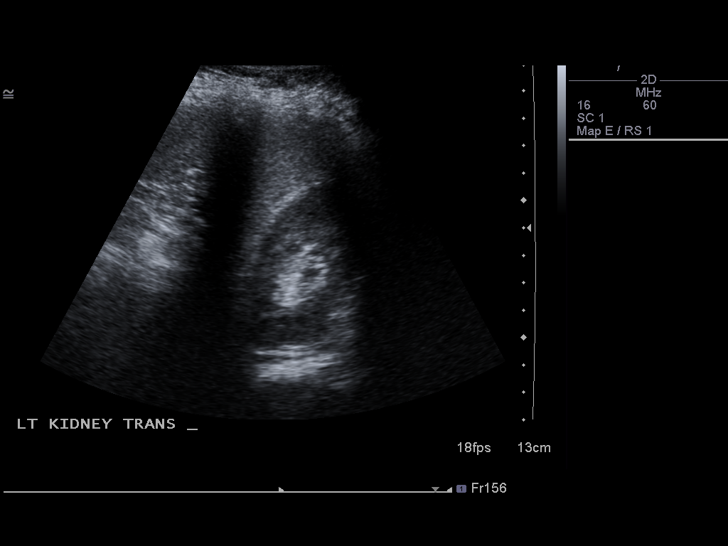
[im 36/36]
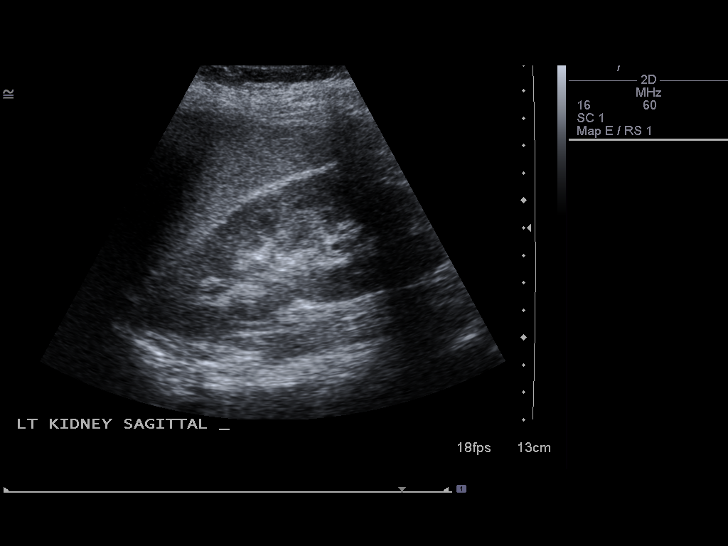

[14 of 25 positions shown; findings below may reference images not displayed]

FINDINGS: Right Kidney:  12.1 cm length.  Normal cortical thickness and
echogenicity.  Negative for hydronephrosis.  No echogenic right
renal calculi or perinephric fluid collection.

Left Kidney:  11.3 cm length.  Normal cortical thickness and
echogenicity.  Negative for hydronephrosis.  No echogenic left
renal calculi or perinephric fluid collection.

Bladder:  Dependently within the bladder, there is an echogenic
shadowing calculus measuring 2.9 cm in greatest diameter consistent
with a bladder calculus.

Additionally, within the left adnexa, there is a minimally complex
3.6 x 4.7 cm cyst with internal echoes and through transmission
suspicious for a complex left ovarian cyst.  No pelvic free fluid
identified.
IMPRESSION: Negative for hydronephrosis or acute upper urinary tract finding.
2.9 cm dependent bladder calculus
Minimally complex left ovarian cyst, maximal diameter 4.7 cm

## 2011-02-15 LAB — URINALYSIS, ROUTINE W REFLEX MICROSCOPIC
Leukocytes, UA: NEGATIVE
Nitrite: NEGATIVE
Specific Gravity, Urine: 1.025
pH: 5.5

## 2011-02-15 LAB — HEMOGLOBIN AND HEMATOCRIT, BLOOD
HCT: 27.7 — ABNORMAL LOW
Hemoglobin: 9.6 — ABNORMAL LOW

## 2011-02-15 LAB — CROSSMATCH: ABO/RH(D): O POS

## 2011-02-15 LAB — CBC
HCT: 29.5 — ABNORMAL LOW
Hemoglobin: 10.1 — ABNORMAL LOW
Hemoglobin: 12.4
MCHC: 34.3
MCHC: 34.5
MCV: 89.5
MCV: 90.6
Platelets: 259
Platelets: 303
RBC: 3.13 — ABNORMAL LOW
RBC: 4.02
RDW: 12.1
RDW: 12.3
WBC: 14.1 — ABNORMAL HIGH
WBC: 9

## 2011-02-15 LAB — COMPREHENSIVE METABOLIC PANEL
AST: 30
Albumin: 3.6
Alkaline Phosphatase: 53
Chloride: 101
GFR calc Af Amer: >60
Potassium: 4
Total Bilirubin: 0.8
Total Protein: 7.4

## 2011-02-15 LAB — BASIC METABOLIC PANEL
BUN: 9
CO2: 30
Calcium: 8.8
GFR calc non Af Amer: >60
Glucose, Bld: 89
Potassium: 4.2

## 2011-02-15 LAB — ABO/RH: ABO/RH(D): O POS

## 2011-02-15 LAB — URINE MICROSCOPIC-ADD ON

## 2011-02-16 LAB — CBC
HCT: 37
MCHC: 34.4
MCV: 89.4
RBC: 4.14
WBC: 7.6

## 2011-02-16 LAB — URINE CULTURE: Colony Count: NO GROWTH

## 2011-02-19 LAB — DIFFERENTIAL
Basophils Relative: 0 % (ref 0–1)
Basophils Relative: 0 % (ref 0–1)
Eosinophils Absolute: 0.2 10*3/uL (ref 0.0–0.7)
Eosinophils Absolute: 0.3 10*3/uL (ref 0.0–0.7)
Eosinophils Relative: 2 % (ref 0–5)
Lymphs Abs: 2.6 10*3/uL (ref 0.7–4.0)
Monocytes Absolute: 0.7 10*3/uL (ref 0.1–1.0)
Monocytes Relative: 7 % (ref 3–12)
Neutro Abs: 6.9 10*3/uL (ref 1.7–7.7)
Neutro Abs: 7.5 10*3/uL (ref 1.7–7.7)
Neutrophils Relative %: 66 % (ref 43–77)

## 2011-02-19 LAB — CBC
HCT: 31.2 % — ABNORMAL LOW (ref 36.0–46.0)
MCHC: 34.2 g/dL (ref 30.0–36.0)
MCHC: 35 g/dL (ref 30.0–36.0)
MCV: 88.6 fL (ref 78.0–100.0)
MCV: 89.1 fL (ref 78.0–100.0)
MCV: 89.4 fL (ref 78.0–100.0)
Platelets: 178 10*3/uL (ref 150–400)
Platelets: 192 10*3/uL (ref 150–400)
Platelets: 269 10*3/uL (ref 150–400)
Platelets: 292 10*3/uL (ref 150–400)
RBC: 3.32 MIL/uL — ABNORMAL LOW (ref 3.87–5.11)
RDW: 12.3 % (ref 11.5–15.5)
WBC: 10.4 10*3/uL (ref 4.0–10.5)
WBC: 10.8 10*3/uL — ABNORMAL HIGH (ref 4.0–10.5)
WBC: 13.5 10*3/uL — ABNORMAL HIGH (ref 4.0–10.5)
WBC: 15.3 10*3/uL — ABNORMAL HIGH (ref 4.0–10.5)

## 2011-02-19 LAB — URINALYSIS, ROUTINE W REFLEX MICROSCOPIC
Nitrite: NEGATIVE
Nitrite: NEGATIVE
Protein, ur: >300 mg/dL — AB
Specific Gravity, Urine: 1.017 (ref 1.005–1.030)
Urobilinogen, UA: 0.2 mg/dL (ref 0.0–1.0)
pH: 5.5 (ref 5.0–8.0)

## 2011-02-19 LAB — URINE MICROSCOPIC-ADD ON

## 2011-02-19 LAB — COMPREHENSIVE METABOLIC PANEL
ALT: 16 U/L (ref 0–35)
AST: 19 U/L (ref 0–37)
Albumin: 3.7 g/dL (ref 3.5–5.2)
Alkaline Phosphatase: 52 U/L (ref 39–117)
GFR calc Af Amer: >60 mL/min (ref >60)
Potassium: 3.4 mEq/L — ABNORMAL LOW (ref 3.5–5.1)
Sodium: 137 mEq/L (ref 135–145)
Total Protein: 6.8 g/dL (ref 6.0–8.3)

## 2011-02-19 LAB — BASIC METABOLIC PANEL
BUN: 10 mg/dL (ref 6–23)
BUN: 11 mg/dL (ref 6–23)
BUN: 5 mg/dL — ABNORMAL LOW (ref 6–23)
CO2: 23 mEq/L (ref 19–32)
CO2: 24 mEq/L (ref 19–32)
Calcium: 8.5 mg/dL (ref 8.4–10.5)
Calcium: 9.3 mg/dL (ref 8.4–10.5)
Chloride: 108 mEq/L (ref 96–112)
Chloride: 109 mEq/L (ref 96–112)
Chloride: 109 mEq/L (ref 96–112)
Creatinine, Ser: 0.66 mg/dL (ref 0.4–1.2)
Creatinine, Ser: 0.76 mg/dL (ref 0.4–1.2)
Creatinine, Ser: 0.78 mg/dL (ref 0.4–1.2)
GFR calc Af Amer: >60 mL/min (ref >60)
GFR calc Af Amer: >60 mL/min (ref >60)
Glucose, Bld: 110 mg/dL — ABNORMAL HIGH (ref 70–99)
Glucose, Bld: 117 mg/dL — ABNORMAL HIGH (ref 70–99)
Potassium: 4.2 mEq/L (ref 3.5–5.1)

## 2011-02-19 LAB — POCT I-STAT, CHEM 8
Creatinine, Ser: 1 mg/dL (ref 0.4–1.2)
HCT: 32 % — ABNORMAL LOW (ref 36.0–46.0)
Hemoglobin: 10.9 g/dL — ABNORMAL LOW (ref 12.0–15.0)
Potassium: 3.6 mEq/L (ref 3.5–5.1)
Sodium: 139 mEq/L (ref 135–145)

## 2011-02-19 LAB — URINE CULTURE
Colony Count: NO GROWTH
Colony Count: NO GROWTH
Culture: NO GROWTH

## 2011-02-19 LAB — PREGNANCY, URINE: Preg Test, Ur: NEGATIVE

## 2011-02-19 LAB — HEMOGLOBIN AND HEMATOCRIT, BLOOD: HCT: 35.1 % — ABNORMAL LOW (ref 36.0–46.0)

## 2011-02-19 LAB — PROTIME-INR: INR: 1 (ref 0.00–1.49)

## 2012-01-25 ENCOUNTER — Encounter (HOSPITAL_COMMUNITY): Payer: Self-pay | Admitting: *Deleted

## 2012-01-25 ENCOUNTER — Emergency Department (HOSPITAL_COMMUNITY)
Admission: EM | Admit: 2012-01-25 | Discharge: 2012-01-25 | Disposition: A | Discharge status: Home / Self Care | Payer: Self-pay | Attending: Emergency Medicine | Admitting: Emergency Medicine

## 2012-01-25 DIAGNOSIS — R1013 Epigastric pain: Secondary | ICD-10-CM | POA: Insufficient documentation

## 2012-01-25 LAB — URINALYSIS, ROUTINE W REFLEX MICROSCOPIC
Bilirubin Urine: NEGATIVE
Ketones, ur: NEGATIVE mg/dL
Nitrite: NEGATIVE
pH: 5 (ref 5.0–8.0)

## 2012-01-25 LAB — LIPASE, BLOOD: Lipase: 28 U/L (ref 11–59)

## 2012-01-25 LAB — CBC WITH DIFFERENTIAL/PLATELET
Hemoglobin: 12.8 g/dL (ref 12.0–15.0)
Lymphocytes Relative: 33 % (ref 12–46)
Lymphs Abs: 1.6 10*3/uL (ref 0.7–4.0)
Monocytes Relative: 9 % (ref 3–12)
Neutro Abs: 2.7 10*3/uL (ref 1.7–7.7)
Neutrophils Relative %: 56 % (ref 43–77)
RBC: 4.22 MIL/uL (ref 3.87–5.11)

## 2012-01-25 LAB — COMPREHENSIVE METABOLIC PANEL
Albumin: 4.1 g/dL (ref 3.5–5.2)
Alkaline Phosphatase: 58 U/L (ref 39–117)
BUN: 17 mg/dL (ref 6–23)
Chloride: 104 mEq/L (ref 96–112)
GFR calc Af Amer: >90 mL/min (ref >90)
Glucose, Bld: 95 mg/dL (ref 70–99)
Potassium: 3.6 mEq/L (ref 3.5–5.1)
Total Bilirubin: 0.4 mg/dL (ref 0.3–1.2)

## 2012-01-25 MED ORDER — HYDROCODONE-ACETAMINOPHEN 5-500 MG PO TABS: 1.0000 | ORAL_TABLET | Freq: Four times a day (QID) | ORAL | Status: AC | PRN

## 2012-01-25 MED ORDER — FAMOTIDINE 20 MG PO TABS
20.0000 mg | ORAL_TABLET | Freq: Once | ORAL | Status: AC
  Administered 2012-01-25: 20 mg via ORAL
  Filled 2012-01-25: qty 1

## 2012-01-25 MED ORDER — FAMOTIDINE 20 MG PO TABS: 20.0000 mg | ORAL_TABLET | Freq: Two times a day (BID) | ORAL | Status: DC

## 2012-01-25 MED ORDER — OXYCODONE-ACETAMINOPHEN 5-325 MG PO TABS
1.0000 | ORAL_TABLET | Freq: Once | ORAL | Status: AC
  Administered 2012-01-25: 1 via ORAL
  Filled 2012-01-25: qty 1

## 2012-01-25 MED ORDER — GI COCKTAIL ~~LOC~~
30.0000 mL | Freq: Once | ORAL | Status: AC
  Administered 2012-01-25: 30 mL via ORAL
  Filled 2012-01-25: qty 30

## 2012-01-25 MED ORDER — OMEPRAZOLE 20 MG PO CPDR: 20.0000 mg | DELAYED_RELEASE_CAPSULE | Freq: Every day | ORAL | Status: DC

## 2012-01-25 NOTE — ED Notes (Signed)
Pt will need interpretor phone, speaks spanish. reports abdominal pain intermittent 10/10 in upper abdominal area. Pt denies vomiting. Denies trauma to stomach. Denies pain with urination. Hx of hysterectomy only surgery.

## 2012-01-25 NOTE — ED Provider Notes (Signed)
History     CSN: [id number]  Arrival date & time 01/25/12  0808   First MD Initiated Contact with Patient 01/25/12 [phone]      Chief Complaint  Patient presents with  . Abdominal Pain    (Consider location/radiation/quality/duration/timing/severity/associated sxs/prior treatment) Patient is a 31 y.o. female presenting with abdominal pain. The history is provided by the patient. The history is limited by a language barrier. A language interpreter was used.  Abdominal Pain The primary symptoms of the illness include abdominal pain. The primary symptoms of the illness do not include fever, nausea, vomiting, diarrhea, hematemesis, dysuria or vaginal discharge. The current episode started yesterday. The onset of the illness was sudden. The problem has been gradually worsening.  The patient states that she believes she is currently not pregnant. The patient has not had a change in bowel habit. Symptoms associated with the illness do not include chills, anorexia, constipation, urgency, hematuria, frequency or back pain.  PT states pain in the upper abdomen. Worse with standing. Better when pressing on that area. No similar pain in the past. Denies n/v/d. Hx of cholecystectomy. No urinary symptoms. No flank pain. No mediations taken prior the arrival.   History reviewed. No pertinent past medical history.  History reviewed. No pertinent past surgical history.  No family history on file.  History  Substance Use Topics  . Smoking status: Never Smoker   . Smokeless tobacco: Not on file  . Alcohol Use: No    OB History    Grav Para Term Preterm Abortions TAB SAB Ect Mult Living                  Review of Systems  Constitutional: Negative for fever and chills.  HENT: Negative for neck pain and neck stiffness.   Respiratory: Negative.   Cardiovascular: Negative.   Gastrointestinal: Positive for abdominal pain. Negative for nausea, vomiting, diarrhea, constipation, anorexia and  hematemesis.  Genitourinary: Negative for dysuria, urgency, frequency, hematuria and vaginal discharge.  Musculoskeletal: Negative.  Negative for back pain.  Skin: Negative.   Neurological: Negative for dizziness, weakness, light-headedness and headaches.  Hematological: Negative.     Allergies  Review of patient's allergies indicates no known allergies.  Home Medications  No current outpatient prescriptions on file.  BP 117/77  Pulse 67  Temp 98.9 F (37.2 C) (Oral)  Resp 15  SpO2 98%  Physical Exam  Nursing note and vitals reviewed. Constitutional: She is oriented to person, place, and time. She appears well-developed and well-nourished. No distress.  HENT:  Head: Normocephalic and atraumatic.  Eyes: Conjunctivae are normal.  Neck: Neck supple.  Cardiovascular: Normal rate, regular rhythm and normal heart sounds.   Pulmonary/Chest: Effort normal and breath sounds normal. No respiratory distress. She has no wheezes. She has no rales.  Abdominal: Bowel sounds are normal. She exhibits no distension. There is no rebound and no guarding.       Epigastric tenderness, no CVA tenderness  Musculoskeletal: Normal range of motion. She exhibits no edema.  Neurological: She is alert and oriented to person, place, and time.  Skin: Skin is warm and dry.  Psychiatric: She has a normal mood and affect.    ED Course  Procedures (including critical care time) Pt with upper abdominal pain. No associated symptoms. Hx of cholecystectomy. Will get LFTs, lipase, UA, CBC. Will try GI coctail.   Results for orders placed during the hospital encounter of 01/25/12  CBC WITH DIFFERENTIAL      Component  Value Range   WBC 4.8  4.0 - 10.5 K/uL   RBC 4.22  3.87 - 5.11 MIL/uL   Hemoglobin 12.8  12.0 - 15.0 g/dL   HCT 81.1  91.4 - 78.2 %   MCV 86.0  78.0 - 100.0 fL   MCH 30.3  26.0 - 34.0 pg   MCHC 35.3  30.0 - 36.0 g/dL   RDW 95.6  21.3 - 08.6 %   Platelets 204  150 - 400 K/uL   Neutrophils  Relative 56  43 - 77 %   Neutro Abs 2.7  1.7 - 7.7 K/uL   Lymphocytes Relative 33  12 - 46 %   Lymphs Abs 1.6  0.7 - 4.0 K/uL   Monocytes Relative 9  3 - 12 %   Monocytes Absolute 0.4  0.1 - 1.0 K/uL   Eosinophils Relative 2  0 - 5 %   Eosinophils Absolute 0.1  0.0 - 0.7 K/uL   Basophils Relative 0  0 - 1 %   Basophils Absolute 0.0  0.0 - 0.1 K/uL  COMPREHENSIVE METABOLIC PANEL      Component Value Range   Sodium 136  135 - 145 mEq/L   Potassium 3.6  3.5 - 5.1 mEq/L   Chloride 104  96 - 112 mEq/L   CO2 24  19 - 32 mEq/L   Glucose, Bld 95  70 - 99 mg/dL   BUN 17  6 - 23 mg/dL   Creatinine, Ser 5.78  0.50 - 1.10 mg/dL   Calcium 9.1  8.4 - 46.9 mg/dL   Total Protein 7.3  6.0 - 8.3 g/dL   Albumin 4.1  3.5 - 5.2 g/dL   AST 16  0 - 37 U/L   ALT 16  0 - 35 U/L   Alkaline Phosphatase 58  39 - 117 U/L   Total Bilirubin 0.4  0.3 - 1.2 mg/dL   GFR calc non Af Amer >90  >90 mL/min   GFR calc Af Amer >90  >90 mL/min  LIPASE, BLOOD      Component Value Range   Lipase 28  11 - 59 U/L  URINALYSIS, ROUTINE W REFLEX MICROSCOPIC      Component Value Range   Color, Urine YELLOW  YELLOW   APPearance CLEAR  CLEAR   Specific Gravity, Urine 1.033 (*) 1.005 - 1.030   pH 5.0  5.0 - 8.0   Glucose, UA NEGATIVE  NEGATIVE mg/dL   Hgb urine dipstick NEGATIVE  NEGATIVE   Bilirubin Urine NEGATIVE  NEGATIVE   Ketones, ur NEGATIVE  NEGATIVE mg/dL   Protein, ur NEGATIVE  NEGATIVE mg/dL   Urobilinogen, UA 0.2  0.0 - 1.0 mg/dL   Nitrite NEGATIVE  NEGATIVE   Leukocytes, UA NEGATIVE  NEGATIVE   No results found.  Pt had mild improvement with GI cocktail and pain resolution with pepcid and percocet. Pt's abdomen reassessed and is soft, non tender. No acute abdomen. Labs normal, doubt infectious process. Pt does not have gallbladder, lipase and LFTs noramal. Suspect possible GERD vs peptic ulcer disease, will start on H2 blocker and PPI, follow up with pcp.    1. Epigastric abdominal pain       MDM           Lottie Mussel, PA 01/25/12 1531

## 2012-01-25 NOTE — ED Provider Notes (Signed)
Medical screening examination/treatment/procedure(s) were performed by non-physician practitioner and as supervising physician I was immediately available for consultation/collaboration.  Cheri Guppy, MD 01/25/12 1538

## 2012-01-25 NOTE — ED Notes (Signed)
Pt speaks spanish only, interpretor called, pt states having abdominal pain "in pit of stomach", pain 5/10, denies diarrhea or vomiting.

## 2012-01-27 ENCOUNTER — Emergency Department (HOSPITAL_COMMUNITY)
Admission: EM | Admit: 2012-01-27 | Discharge: 2012-01-27 | Disposition: A | Discharge status: Home / Self Care | Payer: Self-pay | Attending: Family Medicine | Admitting: Family Medicine

## 2012-01-27 ENCOUNTER — Emergency Department (HOSPITAL_COMMUNITY)
Admission: EM | Admit: 2012-01-27 | Discharge: 2012-01-27 | Discharge status: Against Medical Advice | Payer: Self-pay | Attending: Emergency Medicine | Admitting: Emergency Medicine

## 2012-01-27 ENCOUNTER — Encounter (HOSPITAL_COMMUNITY): Payer: Self-pay | Admitting: *Deleted

## 2012-01-27 DIAGNOSIS — R1013 Epigastric pain: Secondary | ICD-10-CM | POA: Insufficient documentation

## 2012-01-27 DIAGNOSIS — R112 Nausea with vomiting, unspecified: Secondary | ICD-10-CM | POA: Insufficient documentation

## 2012-01-27 LAB — CBC WITH DIFFERENTIAL/PLATELET
Basophils Relative: 0 % (ref 0–1)
Eosinophils Absolute: 0.2 10*3/uL (ref 0.0–0.7)
Eosinophils Relative: 3 % (ref 0–5)
Hemoglobin: 12.5 g/dL (ref 12.0–15.0)
MCH: 30.8 pg (ref 26.0–34.0)
MCHC: 35.4 g/dL (ref 30.0–36.0)
MCV: 86.9 fL (ref 78.0–100.0)
Monocytes Absolute: 0.5 10*3/uL (ref 0.1–1.0)
Monocytes Relative: 9 % (ref 3–12)
Neutrophils Relative %: 53 % (ref 43–77)

## 2012-01-27 LAB — LIPASE, BLOOD: Lipase: 27 U/L (ref 11–59)

## 2012-01-27 LAB — COMPREHENSIVE METABOLIC PANEL
Albumin: 3.7 g/dL (ref 3.5–5.2)
BUN: 12 mg/dL (ref 6–23)
Calcium: 9.1 mg/dL (ref 8.4–10.5)
Creatinine, Ser: 0.67 mg/dL (ref 0.50–1.10)
GFR calc Af Amer: >90 mL/min (ref >90)
Potassium: 3.5 mEq/L (ref 3.5–5.1)
Total Protein: 7 g/dL (ref 6.0–8.3)

## 2012-01-27 NOTE — ED Notes (Signed)
PT did not answer when called 

## 2012-01-27 NOTE — ED Notes (Signed)
She has meds from 9/10 un sure where she was seen

## 2012-01-27 NOTE — ED Notes (Signed)
Pt called to come to room, no answer 

## 2012-01-27 NOTE — ED Notes (Signed)
The pt is c.o epigastric pain for 3-4 days with nausea and vomiting.  lmp  Inknown.  She speaks little english

## 2012-01-27 NOTE — ED Notes (Signed)
Epigastric pain, no vomiting or diarrhea,

## 2013-04-20 ENCOUNTER — Emergency Department (HOSPITAL_COMMUNITY)
Admission: EM | Admit: 2013-04-20 | Discharge: 2013-04-20 | Disposition: A | Discharge status: Home / Self Care | Payer: Self-pay | Attending: Emergency Medicine | Admitting: Emergency Medicine

## 2013-04-20 DIAGNOSIS — L259 Unspecified contact dermatitis, unspecified cause: Secondary | ICD-10-CM | POA: Insufficient documentation

## 2013-04-20 MED ORDER — TRIAMCINOLONE ACETONIDE 0.5 % EX CREA: TOPICAL_CREAM | Freq: Two times a day (BID) | CUTANEOUS | Status: AC

## 2013-04-20 MED ORDER — FAMOTIDINE 20 MG PO TABS
20.0000 mg | ORAL_TABLET | Freq: Once | ORAL | Status: AC
  Administered 2013-04-20: 20 mg via ORAL
  Filled 2013-04-20: qty 1

## 2013-04-20 MED ORDER — FAMOTIDINE 20 MG PO TABS: 20.0000 mg | ORAL_TABLET | Freq: Two times a day (BID) | ORAL | Status: AC

## 2013-04-20 MED ORDER — TRIAMCINOLONE ACETONIDE 0.5 % EX CREA
TOPICAL_CREAM | Freq: Two times a day (BID) | CUTANEOUS | Status: DC
  Filled 2013-04-20: qty 15

## 2013-04-20 NOTE — ED Notes (Signed)
Pt has rash to abdomen/trunk area. Pt has small areas of red raised bumps to abdomen/trunk. Pt states rash is very itchy. States she has had rash for about 2 weeks. Pt has not taken anything for her symptoms. Pt with no acute distress.

## 2013-04-20 NOTE — ED Provider Notes (Signed)
CSN: [id number]     Arrival date & time 04/20/13  2037 History  This chart was scribed for Earley Favor, NP by Nicholos Johns, ED scribe. This patient was seen in room WTR6/WTR6 and the patient's care was started at 9:20.    Chief Complaint  Patient presents with  . Rash    Patient is a 32 y.o. female presenting with rash. The history is provided by the patient. A language interpreter was used.  Rash Location:  Torso Torso rash location:  L chest, R chest, abd LUQ, abd LLQ, abd RUQ and abd RLQ Quality: dryness, itchiness and redness   Quality: not scaling, not swelling and not weeping   Severity:  Mild Onset quality:  Unable to specify Duration:  2 weeks Timing:  Constant Progression:  Worsening Chronicity:  New Context comment:  Exposed to insulation  Relieved by:  None tried Worsened by:  Nothing tried Ineffective treatments:  None tried Associated symptoms: no abdominal pain, no fever, no myalgias, no shortness of breath and not wheezing    HPI Comments: Carmen Peters is a 32 y.o. female who presents to the Emergency Department complaining of a diffuse, red, itchy rash on her abdomen,lower chest, onset 2 weeks ago, that originally began on the surface of her  left upper torso and has since spread to her entire abdomen. Pt reports that the rash first presented on her skin after completing some insulation work in her house. Pt states she has neverr had this rash before. Pt has not taken anything for relief of symptoms. Pt denies presence of the rash on her legs. Pt is not in acute distress. Pt denies any allergies, or daily medication.  No past medical history on file. No past surgical history on file. No family history on file. History  Substance Use Topics  . Smoking status: Never Smoker   . Smokeless tobacco: Not on file  . Alcohol Use: No   OB History   Grav Para Term Preterm Abortions TAB SAB Ect Mult Living                 Review of Systems  Constitutional:  Negative for fever and chills.  Respiratory: Negative for shortness of breath and wheezing.   Gastrointestinal: Negative for abdominal pain.  Musculoskeletal: Negative for myalgias.  Skin: Positive for rash.  A complete 10 system review of systems was obtained and all systems are negative except as noted in the HPI and PMH.    Allergies  Review of patient's allergies indicates no known allergies.  Home Medications   Current Outpatient Rx  Name  Route  Sig  Dispense  Refill  . famotidine (PEPCID) 20 MG tablet   Oral   Take 1 tablet (20 mg total) by mouth 2 (two) times daily.   30 tablet   0   . triamcinolone cream (KENALOG) 0.5 %   Topical   Apply topically 2 (two) times daily. To skin lesions   30 g   0    Triage Vitals: BP 133/85  Pulse 80  Temp(Src) 98.6 F (37 C) (Oral)  Resp 16  SpO2 100% Physical Exam  Nursing note and vitals reviewed. Constitutional: She is oriented to person, place, and time. She appears well-developed and well-nourished.  HENT:  Head: Normocephalic and atraumatic.  Eyes: Pupils are equal, round, and reactive to light.  Neck: Normal range of motion.  Cardiovascular: Normal rate.   Pulmonary/Chest: Effort normal. No respiratory distress.  Musculoskeletal: Normal range of motion.  She exhibits no edema and no tenderness.  Neurological: She is oriented to person, place, and time. She has normal reflexes.  Skin: Skin is warm and dry. Rash noted.  Red scaly lesions or dominant.  Under the wound and along the breast Regions.  Several, scattered lesions on the anterior abdomen.  There are red, scaly nonraised nontender.  Psychiatric: She has a normal mood and affect. Her behavior is normal.    ED Course  Procedures (including critical care time) DIAGNOSTIC STUDIES: Oxygen Saturation is 100% on room air, normal by my interpretation.    COORDINATION OF CARE: At 9:20 Discussed treatment plan with patient which includes cream for lesions and  itchiness. Patient is instructed to stop scratching and f/u with a her PCP in one week. Patient agrees.   Labs Review Labs Reviewed - No data to display Imaging Review No results found.  EKG Interpretation   None       MDM   1. Contact dermatitis    I personally performed the services described in this documentation, which was scribed in my presence. The recorded information has been reviewed and is accurate.     Arman Filter, NP 04/20/13 [phone]

## 2013-04-22 NOTE — ED Provider Notes (Signed)
Medical screening examination/treatment/procedure(s) were performed by non-physician practitioner and as supervising physician I was immediately available for consultation/collaboration.  EKG Interpretation   None         Suzi Roots, MD 04/22/13 1521

## 2014-08-02 ENCOUNTER — Emergency Department (HOSPITAL_COMMUNITY)
Admission: EM | Admit: 2014-08-02 | Discharge: 2014-08-02 | Disposition: A | Discharge status: Home / Self Care | Payer: Self-pay | Attending: Emergency Medicine | Admitting: Emergency Medicine

## 2014-08-02 ENCOUNTER — Encounter (HOSPITAL_COMMUNITY): Payer: Self-pay | Admitting: Family Medicine

## 2014-08-02 DIAGNOSIS — Z79899 Other long term (current) drug therapy: Secondary | ICD-10-CM | POA: Insufficient documentation

## 2014-08-02 DIAGNOSIS — H9202 Otalgia, left ear: Secondary | ICD-10-CM | POA: Insufficient documentation

## 2014-08-02 DIAGNOSIS — J029 Acute pharyngitis, unspecified: Secondary | ICD-10-CM | POA: Insufficient documentation

## 2014-08-02 LAB — RAPID STREP SCREEN (MED CTR MEBANE ONLY): STREPTOCOCCUS, GROUP A SCREEN (DIRECT): NEGATIVE

## 2014-08-02 MED ORDER — LIDOCAINE VISCOUS 2 % MT SOLN
15.0000 mL | Freq: Once | OROMUCOSAL | Status: AC
  Administered 2014-08-02: 15 mL via OROMUCOSAL
  Filled 2014-08-02: qty 15

## 2014-08-02 MED ORDER — IBUPROFEN 800 MG PO TABS: 800.0000 mg | ORAL_TABLET | Freq: Three times a day (TID) | ORAL | Status: DC

## 2014-08-02 MED ORDER — ACETAMINOPHEN 500 MG PO TABS: 500.0000 mg | ORAL_TABLET | Freq: Four times a day (QID) | ORAL | Status: AC | PRN

## 2014-08-02 NOTE — ED Notes (Signed)
Per interpretor phones, pt here for sore throat and left ear ache.

## 2014-08-02 NOTE — ED Notes (Signed)
Pt made aware to return if symptoms worsen or if any life threatening symptoms occur.   

## 2014-08-02 NOTE — ED Provider Notes (Signed)
CSN: [id number]     Arrival date & time 08/02/14  6295 History   First MD Initiated Contact with Patient 08/02/14 [phone]     Chief Complaint  Patient presents with  . Sore Throat  . Otalgia     (Consider location/radiation/quality/duration/timing/severity/associated sxs/prior Treatment) Patient is a 34 y.o. female presenting with pharyngitis and ear pain.  Sore Throat This is a new problem. The current episode started yesterday. The problem occurs constantly. The problem has been unchanged. Associated symptoms include a sore throat. The symptoms are aggravated by eating and drinking. She has tried nothing for the symptoms. The treatment provided no relief.  Otalgia Location:  Left Behind ear:  No abnormality Quality:  Sharp Onset quality:  Sudden Duration:  1 day Timing:  Constant Progression:  Waxing and waning Chronicity:  New Relieved by:  Nothing Worsened by:  Nothing tried Ineffective treatments:  OTC medications Associated symptoms: rhinorrhea and sore throat     History reviewed. No pertinent past medical history. History reviewed. No pertinent past surgical history. History reviewed. No pertinent family history. History  Substance Use Topics  . Smoking status: Never Smoker   . Smokeless tobacco: Not on file  . Alcohol Use: No   OB History    No data available     Review of Systems  HENT: Positive for ear pain, rhinorrhea and sore throat.   All other systems reviewed and are negative.     Allergies  Review of patient's allergies indicates no known allergies.  Home Medications   Prior to Admission medications   Medication Sig Start Date End Date Taking? Authorizing Provider  famotidine (PEPCID) 20 MG tablet Take 1 tablet (20 mg total) by mouth 2 (two) times daily. 04/20/13   Earley Favor, NP  triamcinolone cream (KENALOG) 0.5 % Apply topically 2 (two) times daily. To skin lesions 04/20/13   Earley Favor, NP   There were no vitals taken for this  visit. Physical Exam  Constitutional: She is oriented to person, place, and time. She appears well-developed and well-nourished. No distress.  HENT:  Head: Normocephalic and atraumatic.  Right Ear: Hearing, tympanic membrane, external ear and ear canal normal.  Left Ear: Hearing, tympanic membrane, external ear and ear canal normal.  Nose: Nose normal.  Mouth/Throat: Uvula is midline. Posterior oropharyngeal erythema present. No oropharyngeal exudate, posterior oropharyngeal edema or tonsillar abscesses.  Eyes: Conjunctivae are normal.  Neck: Normal range of motion. Neck supple.  Cardiovascular: Normal rate, regular rhythm and normal heart sounds.   Pulmonary/Chest: Effort normal and breath sounds normal.  Abdominal: Soft.  Musculoskeletal: Normal range of motion.  Lymphadenopathy:    She has cervical adenopathy.  Neurological: She is alert and oriented to person, place, and time.  Skin: Skin is warm and dry. She is not diaphoretic.  Psychiatric: She has a normal mood and affect.  Nursing note and vitals reviewed.   ED Course  Procedures (including critical care time) Medications  lidocaine (XYLOCAINE) 2 % viscous mouth solution 15 mL (15 mLs Mouth/Throat Given 08/02/14 0911)    Labs Review Labs Reviewed - No data to display  Imaging Review No results found.   EKG Interpretation None      MDM   Final diagnoses:  Otalgia, left  Viral pharyngitis    Filed Vitals:   08/02/14 0908  BP: 120/80  Pulse: 71  Temp: 98.1 F (36.7 C)  Resp: 16   Afebrile, NAD, non-toxic appearing, AAOx4.  Pt afebrile without tonsillar exudate, negative strep.  Presents with mild cervical lymphadenopathy, & dysphagia; diagnosis of viral pharyngitis. No abx indicated. DC w symptomatic tx for pain  Pt does not appear dehydrated, but did discuss importance of water rehydration. Presentation non concerning for PTA or infxn spread to soft tissue. No trismus or uvula deviation. Specific return  precautions discussed. Pt able to drink water in ED without difficulty with intact air way. Recommended PCP follow up.Patient is stable at time of discharge      Francee PiccoloJennifer Marelyn Rouser, PA-C 08/03/14 1535  Pricilla LovelessScott Goldston, MD 08/05/14 [phone]

## 2014-08-02 NOTE — ED Notes (Signed)
Pt reports left ear pain and sore throat, slight redness noted to throat.  Pt alert and oriented, airway intact.

## 2014-08-02 NOTE — Discharge Instructions (Signed)
Please follow up with your primary care physician in 1-2 days. If you do not have one please call the [facility] number listed above. Please alternate between Motrin and Tylenol every three hours for fevers and pain. Please read all discharge instructions and return precautions.  ° °Pharyngitis °Pharyngitis is redness, pain, and swelling (inflammation) of your pharynx.  °CAUSES  °Pharyngitis is usually caused by infection. Most of the time, these infections are from viruses (viral) and are part of a cold. However, sometimes pharyngitis is caused by bacteria (bacterial). Pharyngitis can also be caused by allergies. Viral pharyngitis may be spread from person to person by coughing, sneezing, and personal items or utensils (cups, forks, spoons, toothbrushes). Bacterial pharyngitis may be spread from person to person by more intimate contact, such as kissing.  °SIGNS AND SYMPTOMS  °Symptoms of pharyngitis include:   °· Sore throat.   °· Tiredness (fatigue).   °· Low-grade fever.   °· Headache. °· Joint pain and muscle aches. °· Skin rashes. °· Swollen lymph nodes. °· Plaque-like film on throat or tonsils (often seen with bacterial pharyngitis). °DIAGNOSIS  °Your health care provider will ask you questions about your illness and your symptoms. Your medical history, along with a physical exam, is often all that is needed to diagnose pharyngitis. Sometimes, a rapid strep test is done. Other lab tests may also be done, depending on the suspected cause.  °TREATMENT  °Viral pharyngitis will usually get better in 3-4 days without the use of medicine. Bacterial pharyngitis is treated with medicines that kill germs (antibiotics).  °HOME CARE INSTRUCTIONS  °· Drink enough water and fluids to keep your urine clear or pale yellow.   °· Only take over-the-counter or prescription medicines as directed by your health care provider:   °¨ If you are prescribed antibiotics, make sure you finish them even if you start  to feel better.   °¨ Do not take aspirin.   °· Get lots of rest.   °· Gargle with 8 oz of salt water (½ tsp of salt per 1 qt of water) as often as every 1-2 hours to soothe your throat.   °· Throat lozenges (if you are not at risk for choking) or sprays may be used to soothe your throat. °SEEK MEDICAL CARE IF:  °· You have large, tender lumps in your neck. °· You have a rash. °· You cough up green, yellow-brown, or bloody spit. °SEEK IMMEDIATE MEDICAL CARE IF:  °· Your neck becomes stiff. °· You drool or are unable to swallow liquids. °· You vomit or are unable to keep medicines or liquids down. °· You have severe pain that does not go away with the use of recommended medicines. °· You have trouble breathing (not caused by a stuffy nose). °MAKE SURE YOU:  °· Understand these instructions. °· Will watch your condition. °· Will get help right away if you are not doing well or get worse. °Document Released: 05/03/2005 Document Revised: 02/21/2013 Document Reviewed: 01/08/2013 °[employer]® Patient Information ©2015 [employer]. This information is not intended to replace advice given to you by your health care provider. Make sure you discuss any questions you have with your health care provider. ° °

## 2014-08-04 LAB — CULTURE, GROUP A STREP: Strep A Culture: NEGATIVE

## 2014-10-07 ENCOUNTER — Emergency Department (HOSPITAL_COMMUNITY)
Admission: EM | Admit: 2014-10-07 | Discharge: 2014-10-08 | Disposition: A | Discharge status: Home / Self Care | Payer: Self-pay | Attending: Emergency Medicine | Admitting: Emergency Medicine

## 2014-10-07 ENCOUNTER — Encounter (HOSPITAL_COMMUNITY): Payer: Self-pay | Admitting: Emergency Medicine

## 2014-10-07 DIAGNOSIS — Z79899 Other long term (current) drug therapy: Secondary | ICD-10-CM | POA: Insufficient documentation

## 2014-10-07 DIAGNOSIS — Z791 Long term (current) use of non-steroidal anti-inflammatories (NSAID): Secondary | ICD-10-CM | POA: Insufficient documentation

## 2014-10-07 DIAGNOSIS — R51 Headache: Secondary | ICD-10-CM | POA: Insufficient documentation

## 2014-10-07 DIAGNOSIS — Z7952 Long term (current) use of systemic steroids: Secondary | ICD-10-CM | POA: Insufficient documentation

## 2014-10-07 DIAGNOSIS — R519 Headache, unspecified: Secondary | ICD-10-CM

## 2014-10-07 MED ORDER — SODIUM CHLORIDE 0.9 % IV BOLUS (SEPSIS)
1000.0000 mL | Freq: Once | INTRAVENOUS | Status: AC
  Administered 2014-10-08: 1000 mL via INTRAVENOUS

## 2014-10-07 MED ORDER — DEXAMETHASONE SODIUM PHOSPHATE 10 MG/ML IJ SOLN
10.0000 mg | Freq: Once | INTRAMUSCULAR | Status: AC
  Administered 2014-10-08: 10 mg via INTRAVENOUS
  Filled 2014-10-07: qty 1

## 2014-10-07 MED ORDER — DIPHENHYDRAMINE HCL 50 MG/ML IJ SOLN
25.0000 mg | Freq: Once | INTRAMUSCULAR | Status: AC
  Administered 2014-10-08: 25 mg via INTRAVENOUS
  Filled 2014-10-07: qty 1

## 2014-10-07 MED ORDER — METOCLOPRAMIDE HCL 5 MG/ML IJ SOLN
10.0000 mg | INTRAMUSCULAR | Status: AC
  Administered 2014-10-08: 10 mg via INTRAVENOUS
  Filled 2014-10-07: qty 2

## 2014-10-07 NOTE — ED Provider Notes (Signed)
CSN: [id number]     Arrival date & time 10/07/14  2305 History   First MD Initiated Contact with Patient 10/07/14 2334     Chief Complaint  Patient presents with  . Headache     (Consider location/radiation/quality/duration/timing/severity/associated sxs/prior Treatment) Patient is a 34 y.o. female presenting with headaches. The history is provided by the patient and medical records. The history is limited by a language barrier. A language interpreter was used.  Headache   This is a 34 year old female with no significant past medical history presenting to the ED for a headache. Headache began around 1400 and has been progressively worsening.  Pain localized to the left side of her head, described as a throbbing pain with associated photophobia, phonophobia, and nausea.  She denies any falls, head injury, or trauma. He denies any dizziness, lightheadedness, changes in speech, confusion, ataxia, numbness, or weakness. She is not currently on any type of anticoagulation. She has no history of migraines. She has taken Tylenol prior to arrival without relief. She denies any recent fever, sweats, chills, or neck pain.  No past medical history on file. History reviewed. No pertinent past surgical history. No family history on file. History  Substance Use Topics  . Smoking status: Never Smoker   . Smokeless tobacco: Not on file  . Alcohol Use: No   OB History    No data available     Review of Systems  Neurological: Positive for headaches.      Allergies  Review of patient's allergies indicates no known allergies.  Home Medications   Prior to Admission medications   Medication Sig Start Date End Date Taking? Authorizing Provider  acetaminophen (TYLENOL) 500 MG tablet Take 1 tablet (500 mg total) by mouth every 6 (six) hours as needed. 08/02/14   Jennifer Piepenbrink, PA-C  famotidine (PEPCID) 20 MG tablet Take 1 tablet (20 mg total) by mouth 2 (two) times daily. 04/20/13   Earley Favor, NP  ibuprofen (ADVIL,MOTRIN) 800 MG tablet Take 1 tablet (800 mg total) by mouth 3 (three) times daily. 08/02/14   Jennifer Piepenbrink, PA-C  triamcinolone cream (KENALOG) 0.5 % Apply topically 2 (two) times daily. To skin lesions 04/20/13   Earley Favor, NP   BP 144/80 mmHg  Pulse 89  Temp(Src) 98.1 F (36.7 C) (Oral)  Resp 16  Wt 144 lb (65.318 kg)  SpO2 100%   Physical Exam  Constitutional: She is oriented to person, place, and time. She appears well-developed and well-nourished.  Crying, holding the left side of her head  HENT:  Head: Normocephalic and atraumatic.  Mouth/Throat: Oropharynx is clear and moist.  Eyes: Conjunctivae and EOM are normal. Pupils are equal, round, and reactive to light.  Neck: Normal range of motion and full passive range of motion without pain. Neck supple. No spinous process tenderness and no muscular tenderness present. No rigidity.  Full ROM, no meningismus  Cardiovascular: Normal rate, regular rhythm and normal heart sounds.   Pulmonary/Chest: Effort normal and breath sounds normal.  Abdominal: Soft. Bowel sounds are normal.  Musculoskeletal: Normal range of motion.  Neurological: She is alert and oriented to person, place, and time.  AAOx3, answering questions appropriately; equal strength UE and LE bilaterally; CN grossly intact; moves all extremities appropriately without ataxia; no focal neuro deficits or facial asymmetry appreciated  Skin: Skin is warm and dry.  Psychiatric: She has a normal mood and affect.  Nursing note and vitals reviewed.   ED Course  Procedures (including critical care  time) Labs Review Labs Reviewed  CBC WITH DIFFERENTIAL/PLATELET  COMPREHENSIVE METABOLIC PANEL    Imaging Review No results found.   EKG Interpretation None      MDM   Final diagnoses:  Headache, unspecified headache type   34 year old female with progressively worsening headache beginning at 1400 today. She has no history of  migraines. Associated photophobia, phonophobia, and nausea. Patient afebrile and nontoxic, although she is crying and holding the left side of her head. She has no clinical signs of meningitis and her neurologic exam is nonfocal. Low suspicion for acute intracranial pathology at this time including TIA, stroke, SAH, ICH, or meningitis.  Suspect migraine type headache. Will treat with migraine cocktail and reassess.  After migraine cocktail headache has resolved. Patient states she is feeling much better. Her neurologic exam remains nonfocal. Lab work is reassuring, VS remain stable.  She will be discharged home with supportive care, fioricet if needed for rebound headache.  Discussed plan with patient, he/she acknowledged understanding and agreed with plan of care.  Return precautions given for new or worsening symptoms.  Garlon HatchetLisa M Khamiya Varin, PA-C 10/08/14 0100  Marisa Severinlga Otter, MD 10/08/14 [phone]

## 2014-10-07 NOTE — ED Notes (Signed)
Pt. reports headache with nausea and vomitting onset this evening , denies head injury , no fever or chills.

## 2014-10-08 LAB — CBC WITH DIFFERENTIAL/PLATELET
Basophils Absolute: 0 10*3/uL (ref 0.0–0.1)
Basophils Relative: 0 % (ref 0–1)
EOS ABS: 0.2 10*3/uL (ref 0.0–0.7)
EOS PCT: 2 % (ref 0–5)
HEMATOCRIT: 38 % (ref 36.0–46.0)
HEMOGLOBIN: 13.1 g/dL (ref 12.0–15.0)
LYMPHS ABS: 3.4 10*3/uL (ref 0.7–4.0)
Lymphocytes Relative: 37 % (ref 12–46)
MCH: 30.5 pg (ref 26.0–34.0)
MCHC: 34.5 g/dL (ref 30.0–36.0)
MCV: 88.6 fL (ref 78.0–100.0)
MONOS PCT: 6 % (ref 3–12)
Monocytes Absolute: 0.5 10*3/uL (ref 0.1–1.0)
NEUTROS PCT: 55 % (ref 43–77)
Neutro Abs: 5 10*3/uL (ref 1.7–7.7)
PLATELETS: 217 10*3/uL (ref 150–400)
RBC: 4.29 MIL/uL (ref 3.87–5.11)
RDW: 12.2 % (ref 11.5–15.5)
WBC: 9.1 10*3/uL (ref 4.0–10.5)

## 2014-10-08 LAB — COMPREHENSIVE METABOLIC PANEL
ALK PHOS: 54 U/L (ref 38–126)
ALT: 18 U/L (ref 14–54)
ANION GAP: 10 (ref 5–15)
AST: 24 U/L (ref 15–41)
Albumin: 3.9 g/dL (ref 3.5–5.0)
BUN: 10 mg/dL (ref 6–20)
CHLORIDE: 103 mmol/L (ref 101–111)
CO2: 23 mmol/L (ref 22–32)
Calcium: 9.5 mg/dL (ref 8.9–10.3)
Creatinine, Ser: 0.67 mg/dL (ref 0.44–1.00)
GLUCOSE: 95 mg/dL (ref 65–99)
Potassium: 3.8 mmol/L (ref 3.5–5.1)
SODIUM: 136 mmol/L (ref 135–145)
Total Bilirubin: 0.6 mg/dL (ref 0.3–1.2)
Total Protein: 7.5 g/dL (ref 6.5–8.1)

## 2014-10-08 MED ORDER — BUTALBITAL-APAP-CAFFEINE 50-325-40 MG PO TABS: 1.0000 | ORAL_TABLET | Freq: Four times a day (QID) | ORAL | Status: AC | PRN

## 2014-10-08 NOTE — Discharge Instructions (Signed)
Take the prescribed medication as directed. °Return to the ED for new or worsening symptoms. ° °

## 2015-10-27 ENCOUNTER — Emergency Department (HOSPITAL_COMMUNITY)
Admission: EM | Admit: 2015-10-27 | Discharge: 2015-10-27 | Disposition: A | Discharge status: Home / Self Care | Payer: Self-pay | Attending: Dermatology | Admitting: Dermatology

## 2015-10-27 ENCOUNTER — Encounter (HOSPITAL_COMMUNITY): Payer: Self-pay | Admitting: *Deleted

## 2015-10-27 DIAGNOSIS — Z5321 Procedure and treatment not carried out due to patient leaving prior to being seen by health care provider: Secondary | ICD-10-CM | POA: Insufficient documentation

## 2015-10-27 DIAGNOSIS — M542 Cervicalgia: Secondary | ICD-10-CM | POA: Insufficient documentation

## 2015-10-27 DIAGNOSIS — R200 Anesthesia of skin: Secondary | ICD-10-CM | POA: Insufficient documentation

## 2015-10-27 NOTE — ED Notes (Signed)
Call pt x3 for pod e [phone]  No answer

## 2015-10-27 NOTE — ED Notes (Addendum)
PT states she was removing something out of the work fridge at 4 am when she began experiencing L neck and L occipital pain and R arm numbness.  Strength equal in all extremities.  Spoke with Dr Effie ShyWentz who does not believe pt is having a stroke.

## 2016-08-15 ENCOUNTER — Emergency Department (HOSPITAL_COMMUNITY): Payer: Self-pay

## 2016-08-15 ENCOUNTER — Encounter (HOSPITAL_COMMUNITY): Payer: Self-pay | Admitting: *Deleted

## 2016-08-15 ENCOUNTER — Emergency Department (HOSPITAL_COMMUNITY)
Admission: EM | Admit: 2016-08-15 | Discharge: 2016-08-15 | Disposition: A | Discharge status: Home / Self Care | Payer: Self-pay | Attending: Emergency Medicine | Admitting: Emergency Medicine

## 2016-08-15 DIAGNOSIS — S61412A Laceration without foreign body of left hand, initial encounter: Secondary | ICD-10-CM | POA: Insufficient documentation

## 2016-08-15 DIAGNOSIS — Y939 Activity, unspecified: Secondary | ICD-10-CM | POA: Insufficient documentation

## 2016-08-15 DIAGNOSIS — W260XXA Contact with knife, initial encounter: Secondary | ICD-10-CM | POA: Insufficient documentation

## 2016-08-15 DIAGNOSIS — Y999 Unspecified external cause status: Secondary | ICD-10-CM | POA: Insufficient documentation

## 2016-08-15 DIAGNOSIS — Y929 Unspecified place or not applicable: Secondary | ICD-10-CM | POA: Insufficient documentation

## 2016-08-15 MED ORDER — LIDOCAINE-EPINEPHRINE (PF) 2 %-1:200000 IJ SOLN
10.0000 mL | Freq: Once | INTRAMUSCULAR | Status: AC
  Administered 2016-08-15: 10 mL
  Filled 2016-08-15: qty 20

## 2016-08-15 MED ORDER — TETANUS-DIPHTH-ACELL PERTUSSIS 5-2.5-18.5 LF-MCG/0.5 IM SUSP
0.5000 mL | Freq: Once | INTRAMUSCULAR | Status: AC
  Administered 2016-08-15: 0.5 mL via INTRAMUSCULAR
  Filled 2016-08-15: qty 0.5

## 2016-08-15 MED ORDER — CEPHALEXIN 500 MG PO CAPS: 500.0000 mg | ORAL_CAPSULE | Freq: Four times a day (QID) | ORAL | 0 refills | Status: AC

## 2016-08-15 NOTE — ED Triage Notes (Signed)
Pt was cutting plastic bottle and cut her L hand.  Approx 2 inch lac.  Pt states numbness to palm of hand.  Able to move all fingers.  Bleeding controlled.

## 2016-08-15 NOTE — ED Provider Notes (Signed)
[facility]-EMERGENCY DEPT Provider Note   CSN: [id number] Arrival date & time: 08/15/16  1653   By signing my name below, I, Carmen Peters, attest that this documentation has been prepared under the direction and in the presence of  [employer]. Electronically Signed: Clovis Pu, ED Scribe. 08/15/16. 6:19 PM.   History   Chief Complaint Chief Complaint  Patient presents with  . Hand Injury    HPI Comments:  Carmen Peters is a 36 y.o. female who presents to the Emergency Department complaining of acute onset laceration with associated throbbing, shooting pain to the affected area s/p an incident which occurred in the afternoon today. Pt states she was cutting a plastic bottle with a knife when she sustained her injury. She also reports numbness to the entire palmar side of her left hand. No alleviating factors noted. Pt denies any other associated symptoms. She denies anticoagulant use. Pt is right hand dominant. Tetanus status is unknown. No other complaints noted.   The history is provided by the patient. No language interpreter was used.    History reviewed. No pertinent past medical history.  There are no active problems to display for this patient.   Past Surgical History:  Procedure Laterality Date  . ABDOMINAL HYSTERECTOMY    . CESAREAN SECTION      OB History    No data available       Home Medications    Prior to Admission medications   Medication Sig Start Date End Date Taking? Authorizing Provider  acetaminophen (TYLENOL) 500 MG tablet Take 1 tablet (500 mg total) by mouth every 6 (six) hours as needed. Patient not taking: Reported on 10/08/2014 08/02/14   Francee Piccolo, PA-C  cephALEXin (KEFLEX) 500 MG capsule Take 1 capsule (500 mg total) by mouth 4 (four) times daily. 08/15/16   Liberty Handy, PA-C  famotidine (PEPCID) 20 MG tablet Take 1 tablet (20 mg total) by mouth 2 (two) times daily. Patient not taking: Reported on 10/08/2014 04/20/13   Earley Favor, NP  ibuprofen (ADVIL,MOTRIN) 800 MG tablet Take 1 tablet (800 mg total) by mouth 3 (three) times daily. Patient not taking: Reported on 10/08/2014 08/02/14   Francee Piccolo, PA-C  triamcinolone cream (KENALOG) 0.5 % Apply topically 2 (two) times daily. To skin lesions Patient not taking: Reported on 10/08/2014 04/20/13   Earley Favor, NP    Family History No family history on file.  Social History Social History  Substance Use Topics  . Smoking status: Never Smoker  . Smokeless tobacco: Never Used  . Alcohol use No     Allergies   Patient has no known allergies.   Review of Systems Review of Systems  Skin: Positive for wound.  Neurological: Positive for numbness.     Physical Exam Updated Vital Signs BP 123/84 (BP Location: Right Arm)   Pulse 87   Temp 98.3 F (36.8 C) (Oral)   Resp 18   Wt 65.3 kg   SpO2 100%   Physical Exam  Constitutional: She is oriented to person, place, and time. She appears well-developed and well-nourished. No distress.  HENT:  Head: Normocephalic and atraumatic.  Eyes: Conjunctivae are normal.  Cardiovascular: Normal rate.   Radial pulses strong and symmetric  Pulmonary/Chest: Effort normal.  Abdominal: She exhibits no distension.  Musculoskeletal: Normal range of motion. She exhibits tenderness.  Patient in tears, cannot tolerate pain for thumb ROM without analgesia. Will reassess ROM after lidocaine/epi infiltration.   Left thumb range of motion  intact including flexion, extension, abduction, abduction and opposition.  Neurological: She is alert and oriented to person, place, and time.  5/5 strength with left hand abduction against resistance. Sensation to light touch in the radial, medial, ulnar distribution intact of the left hand. Sharp and dull sensation intact in the left hand. 2 point discrimination intact in the left hand. Strong hand grip.  Skin: Skin is warm and dry. Laceration noted.  There is a straight 2 cm  laceration to the center part of the thenar eminence of the left palm and a second laceration 1 cm laceration below.  <2 capillary refill at left thumb nail bed  Psychiatric: She has a normal mood and affect.  Nursing note and vitals reviewed.    ED Treatments / Results  DIAGNOSTIC STUDIES:  Oxygen Saturation is 100% on RA, normal by my interpretation.    COORDINATION OF CARE:  6:14 PM Will XR and perform laceration repair. Discussed treatment plan with pt at bedside and pt agreed to plan.  Labs (all labs ordered are listed, but only abnormal results are displayed) Labs Reviewed - No data to display  EKG  EKG Interpretation None       Radiology Dg Hand Complete Left  Result Date: 08/15/2016 CLINICAL DATA:  Nine laceration, numbness to palm of hand, laceration at anterior thumb EXAM: LEFT HAND - COMPLETE 3+ VIEW COMPARISON:  None FINDINGS: Osseous mineralization normal. Joint spaces preserved. No acute fracture, dislocation, or bone destruction. Dressing artifacts overlying first metacarpal. No radiopaque foreign body or soft tissue gas identified. IMPRESSION: No acute osseous abnormalities. Electronically Signed   By: Ulyses Southward M.D.   On: 08/15/2016 19:08    Procedures .Marland KitchenLaceration Repair Date/Time: 08/15/2016 6:20 PM Performed by: Liberty Handy Authorized by: Liberty Handy   Consent:    Consent obtained:  Verbal   Consent given by:  Patient   Risks discussed:  Infection, pain and poor cosmetic result Anesthesia (see MAR for exact dosages):    Anesthesia method:  Local infiltration   Local anesthetic:  Lidocaine 2% WITH epi Laceration details:    Location:  Hand   Hand location:  L palm   Length (cm):  2 Repair type:    Repair type:  Simple Pre-procedure details:    Preparation:  Patient was prepped and draped in usual sterile fashion and imaging obtained to evaluate for foreign bodies Exploration:    Wound exploration: wound explored through full range  of motion and entire depth of wound probed and visualized     Wound extent: no foreign bodies/material noted, no muscle damage noted and no tendon damage noted     Contaminated: no   Treatment:    Area cleansed with:  Betadine   Amount of cleaning:  Standard   Irrigation solution:  Sterile saline   Irrigation method:  Pressure wash   Visualized foreign bodies/material removed: no   Skin repair:    Repair method:  Sutures   Suture size:  5-0   Number of sutures:  6 Approximation:    Approximation:  Close   Vermilion border: well-aligned   .Marland KitchenLaceration Repair Date/Time: 08/15/2016 6:21 PM Performed by: Liberty Handy Authorized by: Liberty Handy   Consent:    Consent obtained:  Verbal   Consent given by:  Patient   Risks discussed:  Infection, pain and poor cosmetic result Anesthesia (see MAR for exact dosages):    Anesthesia method:  Local infiltration   Local anesthetic:  Lidocaine 2%  WITH epi Laceration details:    Location:  Hand   Hand location:  L palm   Length (cm):  1 Repair type:    Repair type:  Simple Pre-procedure details:    Preparation:  Patient was prepped and draped in usual sterile fashion and imaging obtained to evaluate for foreign bodies Exploration:    Wound exploration: wound explored through full range of motion and entire depth of wound probed and visualized     Contaminated: no   Treatment:    Area cleansed with:  Betadine   Amount of cleaning:  Standard   Irrigation solution:  Sterile saline   Irrigation method:  Pressure wash   Visualized foreign bodies/material removed: no   Skin repair:    Repair method:  Sutures   Suture size:  5-0   Number of sutures:  3 Approximation:    Approximation:  Close   Vermilion border: well-aligned     (including critical care time)  Medications Ordered in ED Medications  lidocaine-EPINEPHrine (XYLOCAINE W/EPI) 2 %-1:200000 (PF) injection 10 mL (10 mLs Infiltration Given 08/15/16 1926)  Tdap  (BOOSTRIX) injection 0.5 mL (0.5 mLs Intramuscular Given 08/15/16 1926)     Initial Impression / Assessment and Plan / ED Course  I have reviewed the triage vital signs and the nursing notes.  Pertinent labs & imaging results that were available during my care of the patient were reviewed by me and considered in my medical decision making (see chart for details).  Clinical Course as of Aug 16 2034  Wynelle Link Aug 15, 2016  1930 FINDINGS: Osseous mineralization normal. Joint spaces preserved. No acute fracture, dislocation, or bone destruction. Dressing artifacts overlying first metacarpal. No radiopaque foreign body or soft tissue gas identified.  IMPRESSION: No acute osseous abnormalities. DG Hand Complete Left [CG]    Clinical Course User Index [CG] Liberty Handy, PA-C    Tetanus updated in ED. Laceration occurred < 12 hours prior to repair. Discussed laceration care with pt and answered questions. Pt to f-u for suture removal in 8-10 days and wound check sooner should there be signs of dehiscence or infection. Smaller laceration had some fatty tissue exposed, patient's hands were dirty prior to injury, I will prescribe antibiotics.  Pt is hemodynamically stable with no complaints prior to dc.  Patient does not have a primary care provider or health insurance, advised she contacted [facility] for suture removal or return to the ED if she is unable to establish care with a primary care there.   Final Clinical Impressions(s) / ED Diagnoses   Final diagnoses:  Laceration of left hand without foreign body, initial encounter    New Prescriptions New Prescriptions   CEPHALEXIN (KEFLEX) 500 MG CAPSULE    Take 1 capsule (500 mg total) by mouth 4 (four) times daily.   I personally performed the services described in this documentation, which was scribed in my presence. The recorded information has been reviewed and is accurate.     Liberty Handy,  PA-C 08/15/16 2037    Lavera Guise, MD 08/15/16 2120

## 2016-08-15 NOTE — Discharge Instructions (Signed)
Please keep wound dry for the next 48 hours. After 48 hours you may clean your wound with clean water and soap twice a day.  Avoid getting debris or dirty water in your wound to prevent infection. Take antibiotics as prescribed. Monitor for signs of infection including redness, bleeding, discharge, warmth or fevers and return to the ED if you notice any of these symptoms.    Please remove sutures in 8-10 days. If you do not have a primary care provider you may return to the ED for suture removal.   Por favor, mantn la herida seca durante las prximas 48 horas. Despus de 48 horas, puede limpiar su herida con agua limpia y [address] veces al da. Evite que entre suciedad o agua sucia en la herida para prevenir infecciones. Tome antibiticos. Regrese a la sala de emergencia si nota signos de infeccin que incluyen enrojecimiento, sangrado, secrecin, calor o fiebre.  Tus puntadas debern ser removidas en 8-10 das. Si no tiene un doctor general, puede regresar a la sala de Industrial/product designer las puntadas.  Contacte la clinica de [facility] para obtener un doctor general para atencin medica general. Esta clnica acepta pacientes sin seguro mdico.

## 2016-08-15 NOTE — ED Notes (Signed)
Pt and SO came to ED, pt crying, c/o pain to left hand, wanting something stronger for pain.  States took Tylenol with no relief.  Margarette Asal, PA called, orders for pt to take Tylenol 1000 mg and Ibuprofen 800 mg together every 8 hours; ice packs.  Information given to pt and SO, verbalized understanding.

## 2017-02-06 ENCOUNTER — Emergency Department (HOSPITAL_COMMUNITY): Payer: Self-pay

## 2017-02-06 ENCOUNTER — Encounter (HOSPITAL_COMMUNITY): Payer: Self-pay

## 2017-02-06 DIAGNOSIS — R0789 Other chest pain: Secondary | ICD-10-CM | POA: Insufficient documentation

## 2017-02-06 DIAGNOSIS — R110 Nausea: Secondary | ICD-10-CM | POA: Insufficient documentation

## 2017-02-06 DIAGNOSIS — R51 Headache: Secondary | ICD-10-CM | POA: Insufficient documentation

## 2017-02-06 DIAGNOSIS — R071 Chest pain on breathing: Secondary | ICD-10-CM | POA: Insufficient documentation

## 2017-02-06 LAB — CBC
HCT: 37 % (ref 36.0–46.0)
Hemoglobin: 12.6 g/dL (ref 12.0–15.0)
MCH: 30.4 pg (ref 26.0–34.0)
MCHC: 34.1 g/dL (ref 30.0–36.0)
MCV: 89.4 fL (ref 78.0–100.0)
Platelets: 205 10*3/uL (ref 150–400)
RBC: 4.14 MIL/uL (ref 3.87–5.11)
RDW: 12.2 % (ref 11.5–15.5)
WBC: 6.6 10*3/uL (ref 4.0–10.5)

## 2017-02-06 LAB — BASIC METABOLIC PANEL
Anion gap: 9 (ref 5–15)
BUN: 11 mg/dL (ref 6–20)
CALCIUM: 9 mg/dL (ref 8.9–10.3)
CHLORIDE: 106 mmol/L (ref 101–111)
CO2: 21 mmol/L — ABNORMAL LOW (ref 22–32)
CREATININE: 0.61 mg/dL (ref 0.44–1.00)
Glucose, Bld: 124 mg/dL — ABNORMAL HIGH (ref 65–99)
Potassium: 3.7 mmol/L (ref 3.5–5.1)
SODIUM: 136 mmol/L (ref 135–145)

## 2017-02-06 LAB — I-STAT TROPONIN, ED: TROPONIN I, POC: 0 ng/mL (ref 0.00–0.08)

## 2017-02-06 NOTE — ED Triage Notes (Signed)
Pt states that since Friday she has been having a headache and central CP since Friday with radiation to back along with nausea and SOB. Denies photophobia.

## 2017-02-07 ENCOUNTER — Emergency Department (HOSPITAL_COMMUNITY)
Admission: EM | Admit: 2017-02-07 | Discharge: 2017-02-07 | Disposition: A | Discharge status: Home / Self Care | Payer: Self-pay | Attending: Emergency Medicine | Admitting: Emergency Medicine

## 2017-02-07 ENCOUNTER — Encounter (HOSPITAL_COMMUNITY): Payer: Self-pay | Admitting: Emergency Medicine

## 2017-02-07 ENCOUNTER — Emergency Department (HOSPITAL_COMMUNITY): Payer: Self-pay

## 2017-02-07 DIAGNOSIS — R079 Chest pain, unspecified: Secondary | ICD-10-CM

## 2017-02-07 DIAGNOSIS — R51 Headache: Secondary | ICD-10-CM

## 2017-02-07 DIAGNOSIS — R519 Headache, unspecified: Secondary | ICD-10-CM

## 2017-02-07 LAB — I-STAT TROPONIN, ED: TROPONIN I, POC: 0.01 ng/mL (ref 0.00–0.08)

## 2017-02-07 LAB — D-DIMER, QUANTITATIVE: D-Dimer, Quant: 0.28 ug/mL-FEU (ref 0.00–0.50)

## 2017-02-07 MED ORDER — SODIUM CHLORIDE 0.9 % IV BOLUS (SEPSIS)
1000.0000 mL | Freq: Once | INTRAVENOUS | Status: AC
  Administered 2017-02-07: 1000 mL via INTRAVENOUS

## 2017-02-07 MED ORDER — KETOROLAC TROMETHAMINE 30 MG/ML IJ SOLN
30.0000 mg | Freq: Once | INTRAMUSCULAR | Status: AC
  Administered 2017-02-07: 30 mg via INTRAVENOUS
  Filled 2017-02-07: qty 1

## 2017-02-07 MED ORDER — METHYLPREDNISOLONE SODIUM SUCC 125 MG IJ SOLR
80.0000 mg | Freq: Once | INTRAMUSCULAR | Status: AC
  Administered 2017-02-07: 80 mg via INTRAVENOUS
  Filled 2017-02-07: qty 2

## 2017-02-07 MED ORDER — BUTALBITAL-APAP-CAFFEINE 50-325-40 MG PO TABS: 1.0000 | ORAL_TABLET | Freq: Four times a day (QID) | ORAL | 0 refills | Status: AC | PRN

## 2017-02-07 MED ORDER — METOCLOPRAMIDE HCL 5 MG/ML IJ SOLN
10.0000 mg | Freq: Once | INTRAMUSCULAR | Status: AC
  Administered 2017-02-07: 10 mg via INTRAVENOUS
  Filled 2017-02-07: qty 2

## 2017-02-07 MED ORDER — IBUPROFEN 600 MG PO TABS: 600.0000 mg | ORAL_TABLET | Freq: Four times a day (QID) | ORAL | 0 refills | Status: DC | PRN

## 2017-02-07 NOTE — Discharge Instructions (Signed)
You have been seen today for a headache and chest pain. This has resolved with hydration and anti-inflammatory medication. Be sure to stay well hydrated by drinking plenty of water. With future pain, may take 600 mg of ibuprofen every 6 hours. Take this medication with food to avoid upset stomach. If you still have pain despite taking the ibuprofen, you may add in tylenol. Maximum Tylenol intake from all sources should not exceed 4000 mg in 24 hours. You may also try the Fioricet instead of the Tylenol. Follow-up with a primary care provider on this matter. Return to the ED should any symptoms worsen.

## 2017-02-07 NOTE — ED Provider Notes (Signed)
[facility]-EMERGENCY DEPT Provider Note   CSN: [id number] Arrival date & time: 02/06/17  2158     History   Chief Complaint Chief Complaint  Patient presents with  . Headache  . Chest Pain    HPI Carmen Peters is a 36 y.o. female.  HPI   Carmen Peters is a 36 y.o. female, patient with no pertinent past medical history, presenting to the ED with chest pain for the last three days. Chest pain is left chest, stinging in nature, 5-6/10, radiating to the back. Pain lasts for about 30 minutes at a time and seems to come on even at rest. Pain is worse with deep breathing and palpation.  Also complains of a headache beginning yesterday. Pain is left side parietal and occipital, lasts up to an hour at a time, 8-9/10, nonradiating, "feels like someone is squeezing my head." This headache is different in that her typically headaches are bilateral frontal.  Has taken tylenol without relief, but states it helps her sleep. Endorses nausea. She does endorse left hand tingling intermittently over the past three months.   Denies vomiting/diarrhea, fever/chills, photophobia, vision changes, weakness, difficulty breathing/swallowing, LOC, cough, falls/trauma, or any other complaints.   Denies history of PE/DVT, leg swelling, recent surgery, recent trauma, recent immobilization.   History reviewed. No pertinent past medical history.  There are no active problems to display for this patient.   Past Surgical History:  Procedure Laterality Date  . ABDOMINAL HYSTERECTOMY    . CESAREAN SECTION      OB History    No data available       Home Medications    Prior to Admission medications   Medication Sig Start Date End Date Taking? Authorizing Provider  acetaminophen (TYLENOL) 500 MG tablet Take 1 tablet (500 mg total) by mouth every 6 (six) hours as needed. Patient not taking: Reported on 10/08/2014 08/02/14   Piepenbrink, Victorino Dike, PA-C  butalbital-acetaminophen-caffeine (FIORICET, ESGIC)  [phone] MG tablet Take 1-2 tablets by mouth every 6 (six) hours as needed for headache. 02/07/17 02/07/18  Haylea Schlichting C, PA-C  cephALEXin (KEFLEX) 500 MG capsule Take 1 capsule (500 mg total) by mouth 4 (four) times daily. Patient not taking: Reported on 02/07/2017 08/15/16   Liberty Handy, PA-C  famotidine (PEPCID) 20 MG tablet Take 1 tablet (20 mg total) by mouth 2 (two) times daily. Patient not taking: Reported on 10/08/2014 04/20/13   Earley Favor, NP  ibuprofen (ADVIL,MOTRIN) 600 MG tablet Take 1 tablet (600 mg total) by mouth every 6 (six) hours as needed. 02/07/17   Talayia Hjort C, PA-C  triamcinolone cream (KENALOG) 0.5 % Apply topically 2 (two) times daily. To skin lesions Patient not taking: Reported on 10/08/2014 04/20/13   Earley Favor, NP    Family History No family history on file.  Social History Social History  Substance Use Topics  . Smoking status: Never Smoker  . Smokeless tobacco: Never Used  . Alcohol use No     Allergies   Patient has no known allergies.   Review of Systems Review of Systems  Constitutional: Negative for chills, diaphoresis and fever.  Respiratory: Negative for cough and shortness of breath.   Cardiovascular: Positive for chest pain. Negative for leg swelling.  Gastrointestinal: Positive for nausea. Negative for abdominal pain and vomiting.  Neurological: Positive for headaches. Negative for dizziness, syncope, light-headedness and numbness.  All other systems reviewed and are negative.    Physical Exam Updated Vital Signs BP 135/88 (BP Location: Right Arm)  Pulse 72   Temp 98.1 F (36.7 C) (Oral)   Resp 14   SpO2 100%   Physical Exam  Constitutional: She is oriented to person, place, and time. She appears well-developed and well-nourished. No distress.  HENT:  Head: Normocephalic and atraumatic.  Eyes: Pupils are equal, round, and reactive to light. Conjunctivae and EOM are normal.  Neck: Normal range of motion. Neck supple.    Cardiovascular: Normal rate, regular rhythm, normal heart sounds and intact distal pulses.   Pulmonary/Chest: Effort normal and breath sounds normal. No respiratory distress.  Abdominal: Soft. There is no tenderness. There is no guarding.  Musculoskeletal: She exhibits no edema.  Normal motor function intact in all extremities and spine. No midline spinal tenderness.   Lymphadenopathy:    She has no cervical adenopathy.  Neurological: She is alert and oriented to person, place, and time.  No sensory deficits.  No noted speech deficits. No aphasia. Patient handles oral secretions without difficulty. No noted swallowing defects.  Equal grip strength bilaterally. Strength 5/5 in the upper extremities. Strength 5/5 with flexion and extension of the hips, knees, and ankles bilaterally.  Patellar DTRs 2+ bilaterally Negative Romberg. No gait disturbance.  Coordination intact including heel to shin and finger to nose.  Cranial nerves III-XII grossly intact.  No facial droop.   Skin: Skin is warm and dry. Capillary refill takes less than 2 seconds. She is not diaphoretic.  Psychiatric: She has a normal mood and affect. Her behavior is normal.  Nursing note and vitals reviewed.    ED Treatments / Results  Labs (all labs ordered are listed, but only abnormal results are displayed) Labs Reviewed  BASIC METABOLIC PANEL - Abnormal; Notable for the following:       Result Value   CO2 21 (*)    Glucose, Bld 124 (*)    All other components within normal limits  CBC  D-DIMER, QUANTITATIVE (NOT AT [facility])  I-STAT TROPONIN, ED  I-STAT TROPONIN, ED    EKG  EKG Interpretation  Date/Time:  Sunday February 06 2017 22:02:21 EDT Ventricular Rate:  82 PR Interval:  124 QRS Duration: 80 QT Interval:  380 QTC Calculation: 443 R Axis:   53 Text Interpretation:  Normal sinus rhythm Cannot rule out Anterior infarct , age undetermined Abnormal ECG When compared with ECG of 04/16/2008, No  significant change was found Confirmed by Dione Booze ([id number]) on 02/07/2017 4:50:42 AM       Radiology Dg Chest 2 View  Result Date: 02/06/2017 CLINICAL DATA:  Headache and central chest pain for 3 days, nausea and shortness of breath. EXAM: CHEST  2 VIEW COMPARISON:  None. FINDINGS: Cardiomediastinal silhouette is normal. No pleural effusions or focal consolidations. Trachea projects midline and there is no pneumothorax. Soft tissue planes and included osseous structures are non-suspicious. Surgical clips in the included right abdomen compatible with cholecystectomy. IMPRESSION: Negative chest. Electronically Signed   By: Awilda Metro M.D.   On: 02/06/2017 23:05   Ct Head Wo Contrast  Result Date: 02/07/2017 CLINICAL DATA:  Severe headache.  Worst headache of life. EXAM: CT HEAD WITHOUT CONTRAST TECHNIQUE: Contiguous axial images were obtained from the base of the skull through the vertex without intravenous contrast. COMPARISON:  None. FINDINGS: Brain: No intracranial hemorrhage, mass effect, or midline shift. No hydrocephalus. The basilar cisterns are patent. No evidence of territorial infarct or acute ischemia. No extra-axial or intracranial fluid collection. Vascular: No hyperdense vessel or unexpected calcification. Skull: No fracture or focal  lesion. Sinuses/Orbits: Paranasal sinuses and mastoid air cells are clear. The visualized orbits are unremarkable. Other: None. IMPRESSION: No acute intracranial abnormality. Electronically Signed   By: Rubye Oaks M.D.   On: 02/07/2017 05:04    Procedures Procedures (including critical care time)  Medications Ordered in ED Medications  sodium chloride 0.9 % bolus 1,000 mL (0 mLs Intravenous Stopped 02/07/17 0544)  metoCLOPramide (REGLAN) injection 10 mg (10 mg Intravenous Given 02/07/17 0445)  methylPREDNISolone sodium succinate (SOLU-MEDROL) 125 mg/2 mL injection 80 mg (80 mg Intravenous Given 02/07/17 0444)  ketorolac (TORADOL) 30 MG/ML  injection 30 mg (30 mg Intravenous Given 02/07/17 0445)     Initial Impression / Assessment and Plan / ED Course  I have reviewed the triage vital signs and the nursing notes.  Pertinent labs & imaging results that were available during my care of the patient were reviewed by me and considered in my medical decision making (see chart for details).  Clinical Course as of Feb 07 545  Mon Feb 07, 2017  0520 Patient reexamined and imaging/lab results discussed. Patient voices complete resolution in all of her symptoms.  [SJ]    Clinical Course User Index [SJ] Antaeus Karel C, PA-C    Patient presents with complaint of chest pain and headache. No neurologic deficits noted on exam. Low suspicion for ACS. HEART score is 1, indicating low risk for a cardiac event. Wells criteria score is 0, indicating low risk for PE. Delta troponins negative. D-dimer negative. Patient's complaints resolved with conservative management. Complaints did not recur during the ED course. PCP follow-up. Resources given. The patient was given instructions for home care as well as return precautions. Patient voices understanding of these instructions, accepts the plan, and is comfortable with discharge.    Vitals:   02/06/17 2210 02/07/17 0344 02/07/17 0400 02/07/17 0544  BP: 133/87 135/88 (!) 161/92 126/72  Pulse: 82 72 80 73  Resp: Temp: 98.3 F (36.8 C) 98.1 F (36.7 C)  98.1 F (36.7 C)  TempSrc: Oral Oral  Oral  SpO2: 100% 100% 100% 100%       Final Clinical Impressions(s) / ED Diagnoses   Final diagnoses:  Bad headache  Chest pain, unspecified type    New Prescriptions New Prescriptions   BUTALBITAL-ACETAMINOPHEN-CAFFEINE (FIORICET, ESGIC) 50-325-40 MG TABLET    Take 1-2 tablets by mouth every 6 (six) hours as needed for headache.   IBUPROFEN (ADVIL,MOTRIN) 600 MG TABLET    Take 1 tablet (600 mg total) by mouth every 6 (six) hours as needed.     Anselm Pancoast, PA-C 02/07/17 0546     Dione Booze, MD 02/07/17 [phone]

## 2017-02-07 NOTE — ED Notes (Signed)
Patient transported to CT 

## 2017-02-07 NOTE — ED Notes (Signed)
Pt returned to room D31 from CT

## 2020-07-17 ENCOUNTER — Encounter (HOSPITAL_COMMUNITY): Payer: Self-pay | Admitting: Emergency Medicine

## 2020-07-17 ENCOUNTER — Other Ambulatory Visit: Payer: Self-pay

## 2020-07-17 ENCOUNTER — Emergency Department (HOSPITAL_COMMUNITY)
Admission: EM | Admit: 2020-07-17 | Discharge: 2020-07-18 | Disposition: A | Discharge status: Home / Self Care | Payer: Self-pay | Attending: Emergency Medicine | Admitting: Emergency Medicine

## 2020-07-17 DIAGNOSIS — R300 Dysuria: Secondary | ICD-10-CM | POA: Insufficient documentation

## 2020-07-17 DIAGNOSIS — Z5321 Procedure and treatment not carried out due to patient leaving prior to being seen by health care provider: Secondary | ICD-10-CM | POA: Insufficient documentation

## 2020-07-17 DIAGNOSIS — R1032 Left lower quadrant pain: Secondary | ICD-10-CM | POA: Insufficient documentation

## 2020-07-17 DIAGNOSIS — R110 Nausea: Secondary | ICD-10-CM | POA: Insufficient documentation

## 2020-07-17 DIAGNOSIS — Z8742 Personal history of other diseases of the female genital tract: Secondary | ICD-10-CM | POA: Insufficient documentation

## 2020-07-17 LAB — LIPASE, BLOOD: Lipase: 36 U/L (ref 11–51)

## 2020-07-17 LAB — CBC
HCT: 37.8 % (ref 36.0–46.0)
Hemoglobin: 12.9 g/dL (ref 12.0–15.0)
MCH: 30.9 pg (ref 26.0–34.0)
MCHC: 34.1 g/dL (ref 30.0–36.0)
MCV: 90.4 fL (ref 80.0–100.0)
Platelets: 228 10*3/uL (ref 150–400)
RBC: 4.18 MIL/uL (ref 3.87–5.11)
RDW: 12 % (ref 11.5–15.5)
WBC: 7.5 10*3/uL (ref 4.0–10.5)
nRBC: 0 % (ref 0.0–0.2)

## 2020-07-17 LAB — COMPREHENSIVE METABOLIC PANEL
ALT: 22 U/L (ref 0–44)
AST: 25 U/L (ref 15–41)
Albumin: 3.6 g/dL (ref 3.5–5.0)
Alkaline Phosphatase: 51 U/L (ref 38–126)
Anion gap: 9 (ref 5–15)
BUN: 18 mg/dL (ref 6–20)
CO2: 20 mmol/L — ABNORMAL LOW (ref 22–32)
Calcium: 8.8 mg/dL — ABNORMAL LOW (ref 8.9–10.3)
Chloride: 110 mmol/L (ref 98–111)
Creatinine, Ser: 0.76 mg/dL (ref 0.44–1.00)
GFR, Estimated: >60 mL/min (ref >60)
Glucose, Bld: 115 mg/dL — ABNORMAL HIGH (ref 70–99)
Potassium: 3.5 mmol/L (ref 3.5–5.1)
Sodium: 139 mmol/L (ref 135–145)
Total Bilirubin: 0.7 mg/dL (ref 0.3–1.2)
Total Protein: 6.6 g/dL (ref 6.5–8.1)

## 2020-07-17 LAB — URINALYSIS, ROUTINE W REFLEX MICROSCOPIC
Bilirubin Urine: NEGATIVE
Glucose, UA: NEGATIVE mg/dL
Ketones, ur: NEGATIVE mg/dL
Leukocytes,Ua: NEGATIVE
Nitrite: NEGATIVE
Protein, ur: 100 mg/dL — AB
Specific Gravity, Urine: 1.027 (ref 1.005–1.030)
pH: 5 (ref 5.0–8.0)

## 2020-07-17 LAB — I-STAT BETA HCG BLOOD, ED (MC, WL, AP ONLY): I-stat hCG, quantitative: <5 m[IU]/mL (ref <5)

## 2020-07-17 MED ORDER — ONDANSETRON 4 MG PO TBDP
4.0000 mg | ORAL_TABLET | Freq: Once | ORAL | Status: AC
  Administered 2020-07-17: 4 mg via ORAL
  Filled 2020-07-17: qty 1

## 2020-07-17 MED ORDER — OXYCODONE-ACETAMINOPHEN 5-325 MG PO TABS
1.0000 | ORAL_TABLET | ORAL | Status: DC | PRN
  Administered 2020-07-17: 1 via ORAL
  Filled 2020-07-17: qty 1

## 2020-07-17 NOTE — ED Triage Notes (Signed)
Pt presents with c/o left lower abdominal pain for 5 days. She reports the pain is where her ovaries are located. She states "I feel like something is coming out." Hx of a cyst on her ovary. Nausea but no vomiting or diarrhea. Pain with urination.

## 2020-07-18 ENCOUNTER — Encounter (HOSPITAL_COMMUNITY): Payer: Self-pay

## 2020-07-18 ENCOUNTER — Other Ambulatory Visit: Payer: Self-pay

## 2020-07-18 ENCOUNTER — Emergency Department (HOSPITAL_COMMUNITY): Payer: Self-pay

## 2020-07-18 ENCOUNTER — Emergency Department (HOSPITAL_COMMUNITY)
Admission: EM | Admit: 2020-07-18 | Discharge: 2020-07-18 | Disposition: A | Discharge status: Home / Self Care | Payer: Self-pay | Attending: Emergency Medicine | Admitting: Emergency Medicine

## 2020-07-18 DIAGNOSIS — N83202 Unspecified ovarian cyst, left side: Secondary | ICD-10-CM | POA: Insufficient documentation

## 2020-07-18 DIAGNOSIS — R1032 Left lower quadrant pain: Secondary | ICD-10-CM

## 2020-07-18 LAB — URINALYSIS, ROUTINE W REFLEX MICROSCOPIC
Bacteria, UA: NONE SEEN
Bilirubin Urine: NEGATIVE
Glucose, UA: NEGATIVE mg/dL
Ketones, ur: NEGATIVE mg/dL
Leukocytes,Ua: NEGATIVE
Nitrite: NEGATIVE
Protein, ur: NEGATIVE mg/dL
Specific Gravity, Urine: 1.02 (ref 1.005–1.030)
pH: 5 (ref 5.0–8.0)

## 2020-07-18 MED ORDER — HYDROCODONE-ACETAMINOPHEN 5-325 MG PO TABS: 1.0000 | ORAL_TABLET | Freq: Four times a day (QID) | ORAL | 0 refills | Status: AC | PRN

## 2020-07-18 MED ORDER — IOHEXOL 300 MG/ML  SOLN
100.0000 mL | Freq: Once | INTRAMUSCULAR | Status: AC | PRN
  Administered 2020-07-18: 100 mL via INTRAVENOUS

## 2020-07-18 MED ORDER — NAPROXEN 500 MG PO TABS: 500.0000 mg | ORAL_TABLET | Freq: Two times a day (BID) | ORAL | 0 refills | Status: AC

## 2020-07-18 MED ORDER — MORPHINE SULFATE (PF) 4 MG/ML IV SOLN
4.0000 mg | Freq: Once | INTRAVENOUS | Status: AC
  Administered 2020-07-18: 4 mg via INTRAVENOUS
  Filled 2020-07-18: qty 1

## 2020-07-18 NOTE — ED Triage Notes (Signed)
Pt presents with c/o abdominal pain for 6 days. Pt does have a hx of an ovarian cyst.

## 2020-07-18 NOTE — Discharge Instructions (Signed)
Toma naproxen 2 veces con comida. No toma ibuprofen, advil o aleve con ese medicina.  Toma tylenol si necesita mas dolor.  Toma norco por dolor muy fuerte. Ten cuidado, puede causar cansado. No conduce con esa medicina.  Da [place] Dear cita con el doctor ginecologo por mas evaluacion de quiste de ovario.  Regresa a la sala de emergencia con dolor mas fuerte, fiebre, o nuevas sintomas.

## 2020-07-18 NOTE — ED Provider Notes (Signed)
[facility]-EMERGENCY DEPT Provider Note   CSN: [id number] Arrival date & time: 07/18/20  1808     History Chief Complaint  Patient presents with  . Abdominal Pain    Malayla Granberry is a 40 y.o. female presenting for evaluation of abdominal pain.  Patient states of the past 6 days she has had persistent left lower quadrant abdominal pain.  It is constant, waxing and waning in severity.  She states it is worse when she urinates, but she does not have burning with urination.  No hematuria.  She does feel urinary frequency.  Pain does not radiate.  No symptoms on the right side.  She had nausea yesterday, no vomiting.  She denies fevers, chills, chest pain, shortness of breath, cough, change in bowel movements.  No blood in her stool.  She longer has periods that she had a hysterectomy (for fibroids) with unilateral oophrectomy.  She is also status post cholecystectomy.  She reports no history of similar symptoms.  She has never been diagnosed with diverticulitis.  No history of kidney stones.  She has no medical problems, takes medications daily.  She has not taken anything for today for pain.  No change in pain with oral intake  HPI     History reviewed. No pertinent past medical history.  There are no problems to display for this patient.   Past Surgical History:  Procedure Laterality Date  . ABDOMINAL HYSTERECTOMY    . CESAREAN SECTION       OB History   No obstetric history on file.     History reviewed. No pertinent family history.  Social History   Tobacco Use  . Smoking status: Never Smoker  . Smokeless tobacco: Never Used  Substance Use Topics  . Alcohol use: No  . Drug use: No    Home Medications Prior to Admission medications   Medication Sig Start Date End Date Taking? Authorizing Provider  HYDROcodone-acetaminophen (NORCO/VICODIN) 5-325 MG tablet Take 1 tablet by mouth every 6 (six) hours as needed for severe pain. 07/18/20  Yes  Caccavale, Sophia, PA-C  naproxen (NAPROSYN) 500 MG tablet Take 1 tablet (500 mg total) by mouth 2 (two) times daily. 07/18/20  Yes Caccavale, Sophia, PA-C  acetaminophen (TYLENOL) 500 MG tablet Take 1 tablet (500 mg total) by mouth every 6 (six) hours as needed. Patient not taking: No sig reported 08/02/14   Piepenbrink, Victorino Dike, PA-C  cephALEXin (KEFLEX) 500 MG capsule Take 1 capsule (500 mg total) by mouth 4 (four) times daily. Patient not taking: No sig reported 08/15/16   Liberty Handy, PA-C  famotidine (PEPCID) 20 MG tablet Take 1 tablet (20 mg total) by mouth 2 (two) times daily. Patient not taking: No sig reported 04/20/13   Earley Favor, NP  ibuprofen (ADVIL,MOTRIN) 600 MG tablet Take 1 tablet (600 mg total) by mouth every 6 (six) hours as needed. Patient not taking: No sig reported 02/07/17   Joy, Shawn C, PA-C  triamcinolone cream (KENALOG) 0.5 % Apply topically 2 (two) times daily. To skin lesions Patient not taking: No sig reported 04/20/13   Earley Favor, NP    Allergies    Patient has no known allergies.  Review of Systems   Review of Systems  Gastrointestinal: Positive for abdominal pain and nausea.  Genitourinary: Positive for dysuria and frequency.  All other systems reviewed and are negative.   Physical Exam Updated Vital Signs BP (!) 157/95 (BP Location: Right Arm)   Pulse 74  Temp 99.2 F (37.3 C) (Oral)   Resp 16   SpO2 98%   Physical Exam Vitals and nursing note reviewed.  Constitutional:      General: She is not in acute distress.    Appearance: She is well-developed and well-nourished.  HENT:     Head: Normocephalic and atraumatic.  Eyes:     Extraocular Movements: Extraocular movements intact and EOM normal.     Conjunctiva/sclera: Conjunctivae normal.     Pupils: Pupils are equal, round, and reactive to light.  Cardiovascular:     Rate and Rhythm: Normal rate and regular rhythm.     Pulses: Normal pulses and intact distal pulses.  Pulmonary:      Effort: Pulmonary effort is normal. No respiratory distress.     Breath sounds: Normal breath sounds. No wheezing.  Abdominal:     General: There is no distension.     Palpations: There is no mass.     Tenderness: There is abdominal tenderness. There is left CVA tenderness. There is no right CVA tenderness, guarding or rebound.     Comments: Tenderness palpation of left lower quadrant abdomen with firmness in the area.  Patient with tenderness palpation of left low back and left CVA.  No tenderness palpation of right side abdomen.  No rigidity, guarding, distention.  Negative rebound.  No peritonitis  Musculoskeletal:        General: Normal range of motion.     Cervical back: Normal range of motion and neck supple.  Skin:    General: Skin is warm and dry.     Capillary Refill: Capillary refill takes less than 2 seconds.  Neurological:     Mental Status: She is alert and oriented to person, place, and time.  Psychiatric:        Mood and Affect: Mood and affect normal.     ED Results / Procedures / Treatments   Labs (all labs ordered are listed, but only abnormal results are displayed) Labs Reviewed  URINALYSIS, ROUTINE W REFLEX MICROSCOPIC - Abnormal; Notable for the following components:      Result Value   Hgb urine dipstick MODERATE (*)    All other components within normal limits    EKG None  Radiology CT ABDOMEN PELVIS W CONTRAST  Result Date: 07/18/2020 CLINICAL DATA:  Flank pain, kidney stone suspected Diverticulitis suspected Nausea/vomiting. Pt presents with c/o abdominal pain for 6 days. Pt does have a hx of an ovarian cyst. EXAM: CT ABDOMEN AND PELVIS WITH CONTRAST TECHNIQUE: Multidetector CT imaging of the abdomen and pelvis was performed using the standard protocol following bolus administration of intravenous contrast. CONTRAST:  OMNIPAQUE IOHEXOL 300 MG/ML  SOLN COMPARISON:  CT abdomen pelvis 03/06/2010, ultrasound pelvis 04/27/2010 FINDINGS: Lower chest: No  acute abnormality. Hepatobiliary: No focal liver abnormality. Status post cholecystectomy. No biliary dilatation. Pancreas: No focal lesion. Normal pancreatic contour. No surrounding inflammatory changes. No main pancreatic ductal dilatation. Spleen: Normal in size without focal abnormality. Adrenals/Urinary Tract: No adrenal nodule bilaterally. Bilateral kidneys enhance symmetrically. No hydronephrosis. No hydroureter. The urinary bladder is unremarkable. On delayed imaging, there is no urothelial wall thickening and there are no filling defects in the opacified portions of the bilateral collecting systems or ureters. The left ureter is again noted to be displaced anteriorly and medially from a left ovarian mass. Stomach/Bowel: Stomach is within normal limits. No evidence of bowel wall thickening or dilatation. Appendix appears normal. Vascular/Lymphatic: No abdominal aorta or iliac aneurysm. Mild atherosclerotic plaque  of the aorta and its branches. No abdominal, pelvic, or inguinal lymphadenopathy. Reproductive: Status post hysterectomy. Redemonstration of a grossly similar-appearing cystic, multiloculated, 8.4 x 6.8 cm left ovarian lesion. The lesion is again noted to be inseparable from a short segment of sigmoid colon Other: Trace simple free fluid within the pelvis. No intraperitoneal free gas. No organized fluid collection. Musculoskeletal: No abdominal wall hernia or abnormality No suspicious lytic or blastic osseous lesions. No acute displaced fracture. IMPRESSION: 1. grossly similar-appearing cystic, multiloculated, 8.4 x 6.8 cm left ovarian lesion. Because this lesion is not adequately characterized, prompt [place] is recommended for further evaluation. Note: This recommendation does not apply to premenarchal patients and to those with increased risk (genetic, family history, elevated tumor markers or other high-risk factors) of ovarian cancer. Reference: JACR 2020 Feb; 17(2):248-254. 2. Trace simple free  fluid within the pelvis. Electronically Signed   By: Tish Frederickson M.D.   On: 07/18/2020 20:17   US PELVIC COMPLETE W TRANSVAGINAL AND TORSION R/O  Result Date: 07/18/2020 CLINICAL DATA:  Left lower quadrant abdominal pain, left adnexal mass EXAM: TRANSABDOMINAL AND TRANSVAGINAL ULTRASOUND OF PELVIS DOPPLER ULTRASOUND OF OVARIES TECHNIQUE: Both transabdominal and transvaginal ultrasound examinations of the pelvis were performed. Transabdominal technique was performed for global imaging of the pelvis including uterus, ovaries, adnexal regions, and pelvic cul-de-sac. It was necessary to proceed with endovaginal exam following the transabdominal exam to visualize the adnexal structures. Color and duplex Doppler ultrasound was utilized to evaluate blood flow to the ovaries. COMPARISON:  07/18/2020 FINDINGS: Uterus Surgically absent Right ovary Surgically absent Left ovary Measurements: 11.1 x 8.2 x 8.0 cm = volume: 382 mL. There is a large unilocular left adnexal cyst measuring 8.8 x 7.9 x 7.4 cm. No septations, mural thickening, or nodularity. Pulsed Doppler evaluation of the left ovary demonstrates normal low-resistance arterial and venous waveforms. Other findings No free fluid. IMPRESSION: 1. Large unilocular left adnexal cyst measuring up to 8.8 cm in size. This corresponds to the CT finding. Recommend follow-up US in 3-6 months. Note: This recommendation does not apply to premenarchal patients or to those with increased risk (genetic, family history, elevated tumor markers or other high-risk factors) of ovarian cancer. Reference: Radiology 2019 Nov; 293(2):359-371. 2. No evidence of left ovarian torsion. 3. Hysterectomy and right oophorectomy. Electronically Signed   By: Sharlet Salina M.D.   On: 07/18/2020 21:49    Procedures Procedures   Medications Ordered in ED Medications  iohexol (OMNIPAQUE) 300 MG/ML solution 100 mL (100 mLs Intravenous Contrast Given 07/18/20 1956)  morphine 4 MG/ML injection 4  mg (4 mg Intravenous Given 07/18/20 2137)    ED Course  I have reviewed the triage vital signs and the nursing notes.  Pertinent labs & imaging results that were available during my care of the patient were reviewed by me and considered in my medical decision making (see chart for details).    MDM Rules/Calculators/A&P                          Patient presented for evaluation of left lower quadrant abdominal pain.  On exam, patient is nontoxic.  She does have a firmness when palpating her abdomen.  Considering culture location, consider UTI versus kidney stone versus diverticulitis.  Consider ovarian cause.  Labs obtained from yesterday reviewed by me, overall reassuring, I do not believe she needs repeat.  We will repeat urine as patient has urinary symptoms.  She will need CT scan  for further evaluation.  CT shows large ovarian cyst, however unable to be completely characterized due to size.  No obvious diverticulitis or kidney stone.  Will obtain ultrasound per radiology recommendations.  Ultrasound negative for torsion.  Does show a large cyst.  I discussed findings with patient.  Discussed importance of close follow-up with OB/GYN, resources given.  Will give short course of pain control.  At this time, patient appears safe for discharge.  Return precautions given.  Patient states she understands and agrees to plan.  Final Clinical Impression(s) / ED Diagnoses Final diagnoses:  Left ovarian cyst  LLQ abdominal pain    Rx / DC Orders ED Discharge Orders         Ordered    naproxen (NAPROSYN) 500 MG tablet  2 times daily        07/18/20 2234    HYDROcodone-acetaminophen (NORCO/VICODIN) 5-325 MG tablet  Every 6 hours PRN        07/18/20 2234           Alveria ApleyCaccavale, Sophia, PA-C 07/18/20 2239    Arby BarrettePfeiffer, Marcy, MD 07/21/20 [phone]

## 2020-07-18 NOTE — ED Notes (Signed)
Pt called for rooming x1 without response.

## 2020-09-03 ENCOUNTER — Encounter: Payer: Self-pay | Admitting: Family Medicine

## 2020-09-22 ENCOUNTER — Ambulatory Visit (INDEPENDENT_AMBULATORY_CARE_PROVIDER_SITE_OTHER): Payer: Self-pay | Admitting: Obstetrics and Gynecology

## 2020-09-22 ENCOUNTER — Encounter: Payer: Self-pay | Admitting: Obstetrics and Gynecology

## 2020-09-22 ENCOUNTER — Other Ambulatory Visit: Payer: Self-pay

## 2020-09-22 DIAGNOSIS — R1900 Intra-abdominal and pelvic swelling, mass and lump, unspecified site: Secondary | ICD-10-CM | POA: Insufficient documentation

## 2020-09-22 NOTE — Progress Notes (Signed)
CC: abdominal pain, pelvic mass Subjective:    Patient ID: Earlena Werst, female    DOB: 1981/01/29, 40 y.o.   MRN: [id number]  HPI 40 yo G2P2, SVD x 2.  Pt has had abdominal pain x 2 months.  She denies weight loss/gain.  The pain is noted as crampy like a menstrual cycle.  Pt denies nausea.  Pt denies early satiety.  Pt has had a similar lesion since 2011, but is unsure if this is the same one.  Pt is s/p hysterectomy 2009.   Review of Systems  HENT: Negative.   Respiratory: Negative.   Cardiovascular: Negative.   Gastrointestinal: Positive for abdominal pain.  Genitourinary: Positive for pelvic pain.  Musculoskeletal: Negative.   Neurological: Negative.        Objective:   Physical Exam Vitals reviewed. Exam conducted with a chaperone present.  Constitutional:      Appearance: Normal appearance.  HENT:     Head: Normocephalic and atraumatic.  Cardiovascular:     Rate and Rhythm: Normal rate and regular rhythm.     Heart sounds: Normal heart sounds.  Pulmonary:     Effort: Pulmonary effort is normal. No respiratory distress.     Breath sounds: Normal breath sounds.  Abdominal:     General: Abdomen is flat.     Palpations: Abdomen is soft.     Comments: Mild tenderness LLQ , no mass detected  Genitourinary:    Comments: Uterus and cervix absent Left adnexal fullness detected Skin:    General: Skin is warm and dry.  Neurological:     Mental Status: She is alert.    Vitals:   09/22/20 1353  BP: (!) 184/103  Pulse: 83   CLINICAL DATA:  Left lower quadrant abdominal pain, left adnexal mass   EXAM: TRANSABDOMINAL AND TRANSVAGINAL ULTRASOUND OF PELVIS   DOPPLER ULTRASOUND OF OVARIES   TECHNIQUE: Both transabdominal and transvaginal ultrasound examinations of the pelvis were performed. Transabdominal technique was performed for global imaging of the pelvis including uterus, ovaries, adnexal regions, and pelvic cul-de-sac.   It was necessary to proceed with  endovaginal exam following the transabdominal exam to visualize the adnexal structures. Color and duplex Doppler ultrasound was utilized to evaluate blood flow to the ovaries.   COMPARISON:  07/18/2020   FINDINGS: Uterus   Surgically absent   Right ovary   Surgically absent   Left ovary   Measurements: 11.1 x 8.2 x 8.0 cm = volume: 382 mL. There is a large unilocular left adnexal cyst measuring 8.8 x 7.9 x 7.4 cm. No septations, mural thickening, or nodularity.   Pulsed Doppler evaluation of the left ovary demonstrates normal low-resistance arterial and venous waveforms.   Other findings   No free fluid.   IMPRESSION: 1. Large unilocular left adnexal cyst measuring up to 8.8 cm in size. This corresponds to the CT finding. Recommend follow-up US in 3-6 months. Note: This recommendation does not apply to premenarchal patients or to those with increased risk (genetic, family history, elevated tumor markers or other high-risk factors) of ovarian cancer. Reference: Radiology 2019 Nov; 293(2):359-371. 2. No evidence of left ovarian torsion. 3. Hysterectomy and right oophorectomy CLINICAL DATA:  Flank pain, kidney stone suspected Diverticulitis suspected Nausea/vomiting. Pt presents with c/o abdominal pain for 6 days. Pt does have a hx of an ovarian cyst.   EXAM: CT ABDOMEN AND PELVIS WITH CONTRAST   TECHNIQUE: Multidetector CT imaging of the abdomen and pelvis was performed using the standard protocol following  bolus administration of intravenous contrast.   CONTRAST:  OMNIPAQUE IOHEXOL 300 MG/ML  SOLN   COMPARISON:  CT abdomen pelvis 03/06/2010, ultrasound pelvis 04/27/2010   FINDINGS: Lower chest: No acute abnormality.   Hepatobiliary: No focal liver abnormality. Status post cholecystectomy. No biliary dilatation.   Pancreas: No focal lesion. Normal pancreatic contour. No surrounding inflammatory changes. No main pancreatic ductal dilatation.    Spleen: Normal in size without focal abnormality.   Adrenals/Urinary Tract: No adrenal nodule bilaterally. Bilateral kidneys enhance symmetrically. No hydronephrosis. No hydroureter. The urinary bladder is unremarkable. On delayed imaging, there is no urothelial wall thickening and there are no filling defects in the opacified portions of the bilateral collecting systems or ureters. The left ureter is again noted to be displaced anteriorly and medially from a left ovarian mass.   Stomach/Bowel: Stomach is within normal limits. No evidence of bowel wall thickening or dilatation. Appendix appears normal.   Vascular/Lymphatic: No abdominal aorta or iliac aneurysm. Mild atherosclerotic plaque of the aorta and its branches. No abdominal, pelvic, or inguinal lymphadenopathy.   Reproductive: Status post hysterectomy. Redemonstration of a grossly similar-appearing cystic, multiloculated, 8.4 x 6.8 cm left ovarian lesion. The lesion is again noted to be inseparable from a short segment of sigmoid colon   Other: Trace simple free fluid within the pelvis. No intraperitoneal free gas. No organized fluid collection.   Musculoskeletal:   No abdominal wall hernia or abnormality   No suspicious lytic or blastic osseous lesions. No acute displaced fracture.   IMPRESSION: 1. grossly similar-appearing cystic, multiloculated, 8.4 x 6.8 cm left ovarian lesion. Because this lesion is not adequately characterized, prompt [place] is recommended for further evaluation. Note: This recommendation does not apply to premenarchal patients and to those with increased risk (genetic, family history, elevated tumor markers or other high-risk factors) of ovarian cancer. Reference: JACR 2020 Feb; 17(2):248-254. 2. Trace simple free fluid within the pelvis.       Assessment & Plan:   1. Pelvic mass in female Will check CEA and CA-125 today and order current pelvic u/s to see if there are any changes.  F/u  in 4 weeks for discussion    Warden Fillers, MD Faculty Attending, Center for [facility]

## 2020-09-23 LAB — CEA: CEA: 0.7 ng/mL (ref 0.0–4.7)

## 2020-09-23 LAB — CA 125: Cancer Antigen (CA) 125: 13.5 U/mL (ref 0.0–38.1)

## 2020-09-25 ENCOUNTER — Other Ambulatory Visit: Payer: Self-pay | Admitting: Obstetrics and Gynecology

## 2020-09-25 DIAGNOSIS — Z1231 Encounter for screening mammogram for malignant neoplasm of breast: Secondary | ICD-10-CM

## 2020-09-26 NOTE — BH Specialist Note (Signed)
Pt did not arrive to video visit and did not answer the phone ; Left HIPPA-compliant message to call back Asher Muir from Center for [employer] at [facility] at [phone] (main office) or [phone] [facility] office), via Spanish interpreter [place], [phone]

## 2020-09-29 ENCOUNTER — Ambulatory Visit (HOSPITAL_COMMUNITY): Payer: Self-pay

## 2020-10-01 ENCOUNTER — Ambulatory Visit: Payer: Self-pay | Admitting: Clinical

## 2020-10-01 DIAGNOSIS — Z5329 Procedure and treatment not carried out because of patient's decision for other reasons: Secondary | ICD-10-CM

## 2020-10-01 DIAGNOSIS — Z91199 Patient's noncompliance with other medical treatment and regimen due to unspecified reason: Secondary | ICD-10-CM

## 2020-10-09 ENCOUNTER — Encounter (HOSPITAL_COMMUNITY): Payer: Self-pay

## 2020-10-09 ENCOUNTER — Other Ambulatory Visit: Payer: Self-pay

## 2020-10-09 ENCOUNTER — Emergency Department (HOSPITAL_COMMUNITY): Payer: Self-pay

## 2020-10-09 ENCOUNTER — Emergency Department (HOSPITAL_COMMUNITY)
Admission: EM | Admit: 2020-10-09 | Discharge: 2020-10-09 | Disposition: A | Discharge status: Home / Self Care | Payer: Self-pay | Attending: Emergency Medicine | Admitting: Emergency Medicine

## 2020-10-09 DIAGNOSIS — N83202 Unspecified ovarian cyst, left side: Secondary | ICD-10-CM | POA: Insufficient documentation

## 2020-10-09 DIAGNOSIS — R1011 Right upper quadrant pain: Secondary | ICD-10-CM

## 2020-10-09 LAB — COMPREHENSIVE METABOLIC PANEL
ALT: 36 U/L (ref 0–44)
AST: 34 U/L (ref 15–41)
Albumin: 3.9 g/dL (ref 3.5–5.0)
Alkaline Phosphatase: 62 U/L (ref 38–126)
Anion gap: 7 (ref 5–15)
BUN: 20 mg/dL (ref 6–20)
CO2: 23 mmol/L (ref 22–32)
Calcium: 8.7 mg/dL — ABNORMAL LOW (ref 8.9–10.3)
Chloride: 106 mmol/L (ref 98–111)
Creatinine, Ser: 0.73 mg/dL (ref 0.44–1.00)
GFR, Estimated: >60 mL/min (ref >60)
Glucose, Bld: 114 mg/dL — ABNORMAL HIGH (ref 70–99)
Potassium: 3.8 mmol/L (ref 3.5–5.1)
Sodium: 136 mmol/L (ref 135–145)
Total Bilirubin: 0.3 mg/dL (ref 0.3–1.2)
Total Protein: 7.2 g/dL (ref 6.5–8.1)

## 2020-10-09 LAB — CBC WITH DIFFERENTIAL/PLATELET
Abs Immature Granulocytes: 0.02 10*3/uL (ref 0.00–0.07)
Basophils Absolute: 0 10*3/uL (ref 0.0–0.1)
Basophils Relative: 1 %
Eosinophils Absolute: 0.2 10*3/uL (ref 0.0–0.5)
Eosinophils Relative: 3 %
HCT: 36.4 % (ref 36.0–46.0)
Hemoglobin: 12.2 g/dL (ref 12.0–15.0)
Immature Granulocytes: 0 %
Lymphocytes Relative: 36 %
Lymphs Abs: 2.2 10*3/uL (ref 0.7–4.0)
MCH: 30.5 pg (ref 26.0–34.0)
MCHC: 33.5 g/dL (ref 30.0–36.0)
MCV: 91 fL (ref 80.0–100.0)
Monocytes Absolute: 0.5 10*3/uL (ref 0.1–1.0)
Monocytes Relative: 8 %
Neutro Abs: 3.2 10*3/uL (ref 1.7–7.7)
Neutrophils Relative %: 52 %
Platelets: 200 10*3/uL (ref 150–400)
RBC: 4 MIL/uL (ref 3.87–5.11)
RDW: 11.9 % (ref 11.5–15.5)
WBC: 6.1 10*3/uL (ref 4.0–10.5)
nRBC: 0 % (ref 0.0–0.2)

## 2020-10-09 LAB — URINALYSIS, ROUTINE W REFLEX MICROSCOPIC
Bilirubin Urine: NEGATIVE
Glucose, UA: NEGATIVE mg/dL
Ketones, ur: NEGATIVE mg/dL
Leukocytes,Ua: NEGATIVE
Nitrite: NEGATIVE
Protein, ur: 30 mg/dL — AB
Specific Gravity, Urine: 1.019 (ref 1.005–1.030)
pH: 5 (ref 5.0–8.0)

## 2020-10-09 LAB — I-STAT BETA HCG BLOOD, ED (MC, WL, AP ONLY): I-stat hCG, quantitative: <5 m[IU]/mL (ref <5)

## 2020-10-09 NOTE — ED Provider Notes (Signed)
[facility]   CSN: [id number] Arrival date & time: 10/09/20  2951     History Chief Complaint  Patient presents with  . Abdominal Pain    Carmen Peters is a 40 y.o. female. Interview by Spanish-language interpreter using telemetry.   She presents for evaluation of intermittent right upper quadrant pain that radiates to the right lower back, present for 2 days, with only partial improvement on taking Tylenol.  She denies nausea, vomiting, diarrhea, dysuria, hematuria, urgency, urinary frequency.  No prior similar problems.  She had a cholecystectomy done about 15 years ago.  No other abdominal surgeries.  No known sick contacts.  No respiratory symptoms.  There are no other known modifying factors.     History reviewed. No pertinent past medical history.  Patient Active Problem List   Diagnosis Date Noted  . Pelvic mass in female 09/22/2020    Past Surgical History:  Procedure Laterality Date  . ABDOMINAL HYSTERECTOMY    . CESAREAN SECTION       OB History   No obstetric history on file.     History reviewed. No pertinent family history.  Social History   Tobacco Use  . Smoking status: Never Smoker  . Smokeless tobacco: Never Used  Substance Use Topics  . Alcohol use: No  . Drug use: No    Home Medications Prior to Admission medications   Medication Sig Start Date End Date Taking? Authorizing Provider  acetaminophen (TYLENOL) 500 MG tablet Take 1 tablet (500 mg total) by mouth every 6 (six) hours as needed. Patient not taking: No sig reported 08/02/14   Piepenbrink, Victorino Dike, PA-C  cephALEXin (KEFLEX) 500 MG capsule Take 1 capsule (500 mg total) by mouth 4 (four) times daily. Patient not taking: No sig reported 08/15/16   Liberty Handy, PA-C  famotidine (PEPCID) 20 MG tablet Take 1 tablet (20 mg total) by mouth 2 (two) times daily. Patient not taking: No sig reported 04/20/13   Earley Favor, NP   HYDROcodone-acetaminophen (NORCO/VICODIN) 5-325 MG tablet Take 1 tablet by mouth every 6 (six) hours as needed for severe pain. Patient not taking: Reported on 09/22/2020 07/18/20   Caccavale, Sophia, PA-C  ibuprofen (ADVIL,MOTRIN) 600 MG tablet Take 1 tablet (600 mg total) by mouth every 6 (six) hours as needed. Patient not taking: No sig reported 02/07/17   Joy, Shawn C, PA-C  naproxen (NAPROSYN) 500 MG tablet Take 1 tablet (500 mg total) by mouth 2 (two) times daily. Patient not taking: Reported on 09/22/2020 07/18/20   Caccavale, Sophia, PA-C  triamcinolone cream (KENALOG) 0.5 % Apply topically 2 (two) times daily. To skin lesions Patient not taking: No sig reported 04/20/13   Earley Favor, NP    Allergies    Patient has no known allergies.  Review of Systems   Review of Systems  Physical Exam Updated Vital Signs BP (!) 159/95 (BP Location: Left Arm)   Pulse 83   Temp 98.3 F (36.8 C) (Oral)   Resp 18   Ht 5' (1.524 m)   Wt 70.3 kg   SpO2 100%   BMI 30.27 kg/m   Physical Exam  ED Results / Procedures / Treatments   Labs (all labs ordered are listed, but only abnormal results are displayed) Labs Reviewed  COMPREHENSIVE METABOLIC PANEL - Abnormal; Notable for the following components:      Result Value   Glucose, Bld 114 (*)    Calcium 8.7 (*)    All  other components within normal limits  URINALYSIS, ROUTINE W REFLEX MICROSCOPIC - Abnormal; Notable for the following components:   Hgb urine dipstick SMALL (*)    Protein, ur 30 (*)    Bacteria, UA RARE (*)    All other components within normal limits  CBC WITH DIFFERENTIAL/PLATELET  I-STAT BETA HCG BLOOD, ED ([facility], [facility], [facility] ONLY)    EKG None  Radiology CT Abdomen Pelvis Wo Contrast  Result Date: 10/09/2020 CLINICAL DATA:  Three day history of right upper quadrant pain EXAM: CT ABDOMEN AND PELVIS WITHOUT CONTRAST TECHNIQUE: Multidetector CT imaging of the abdomen and pelvis was performed following the standard protocol  without IV contrast. COMPARISON:  Prior CT abdomen/pelvis 07/18/2020 FINDINGS: Lower chest: No acute abnormality. Hepatobiliary: No focal liver abnormality is seen. Status post cholecystectomy. No biliary dilatation. Pancreas: Unremarkable. No pancreatic ductal dilatation or surrounding inflammatory changes. Spleen: Normal in size without focal abnormality. Adrenals/Urinary Tract: Adrenal glands are unremarkable. Kidneys are normal, without renal calculi, focal lesion, or hydronephrosis. Bladder is unremarkable. Stomach/Bowel: No evidence of obstruction or focal bowel wall thickening. Normal appendix in the right lower quadrant. The terminal ileum is unremarkable. Vascular/Lymphatic: Limited evaluation in the absence of intravenous contrast. No significant atherosclerotic plaque. No evidence of suspicious lymphadenopathy. Reproductive: Surgical changes of prior hysterectomy. Significantly decreased size of left ovarian cystic lesion now measuring 6.0 x 2.9 cm compared to 8.4 x 6.8 cm previously. Other: No abdominal wall hernia or abnormality. No abdominopelvic ascites. Musculoskeletal: No acute fracture or aggressive appearing lytic or blastic osseous lesion. IMPRESSION: 1. No acute abnormality within the abdomen or pelvis. 2. Significantly decreased size of large left ovarian cyst compared to prior imaging from March of 2022. 3. Surgical changes of prior cholecystectomy and hysterectomy. Electronically Signed   By: Malachy Moan M.D.   On: 10/09/2020 08:12    Procedures Procedures   Medications Ordered in ED Medications - No data to display  ED Course  I have reviewed the triage vital signs and the nursing notes.  Pertinent labs & imaging results that were available during my care of the patient were reviewed by me and considered in my medical decision making (see chart for details).    MDM Rules/Calculators/A&P                           Patient Vitals for the past 24 hrs:  BP Temp Temp src  Pulse Resp SpO2 Height Weight  10/09/20 0915 (!) 159/95 -- -- 83 18 100 % -- --  10/09/20 0712 (!) 140/97 -- -- 80 16 100 % -- --  10/09/20 0700 (!) 140/97 -- -- 80 -- 100 % -- --  10/09/20 0650 -- -- -- -- -- -- 5' (1.524 m) 70.3 kg  10/09/20 0649 (!) 163/103 98.3 F (36.8 C) Oral 78 16 100 % -- --    9:24 AM Reevaluation with update and discussion. After initial assessment and treatment, an updated evaluation reveals she is comfortable has no further complaints.  I discussed the findings with her using a interpreter at the bedside.  All questions answered. Mancel Bale   Medical Decision Making:  This patient is presenting for evaluation of abdominal pain, right upper quadrant, status postcholecystectomy 15 years ago.  Symptoms are nonspecific., which does require a range of treatment options, and is a complaint that involves a moderate risk of morbidity and mortality. The differential diagnoses include acute intestinal disorder, bacterial infection, metabolic disorder, worsening ovarian  cyst. I decided to review old records, and in summary middle-aged female presenting with nonspecific complaints, requiring evaluation with imaging and laboratory testing.  I did not require additional historical information from anyone.  Clinical Laboratory Tests Ordered, included CBC, Metabolic panel, Urinalysis and Pregnancy test. Review indicates normal finding. Radiologic Tests Ordered, included CT abdomen pelvis.  I independently Visualized: Radiographic images, which show no acute abnormalities, left ovarian cyst, smaller than prior without complicating features   Critical Interventions-clinical evaluation, laboratory testing, CT imaging, medication treatment, IV fluids, observation and reassess  After These Interventions, the Patient was reevaluated and was found stable for discharge.  Nonspecific pain, with reassuring evaluation in the ED.  Suspect intestinal cramping.  Incidental ovarian cyst  which is improving, now located near her site of pain in the right mid upper quadrants.  Patient instructed on symptomatic treatment with expectant management.  CRITICAL CARE-no Performed by: Mancel Bale  Nursing Notes Reviewed/ Care Coordinated Applicable Imaging Reviewed Interpretation of Laboratory Data incorporated into ED treatment  The patient appears reasonably screened and/or stabilized for discharge and I doubt any other medical condition or other [facility] requiring further screening, evaluation, or treatment in the ED at this time prior to discharge.  Plan: Home Medications-OTC analgesia of choice; Home Treatments-grass advance diet and activity, work release until Monday; return here if the recommended treatment, does not improve the symptoms; Recommended follow up-PCP, as needed     Final Clinical Impression(s) / ED Diagnoses Final diagnoses:  Right upper quadrant abdominal pain  Left ovarian cyst    Rx / DC Orders ED Discharge Orders    None       Mancel Bale, MD 10/09/20 0930

## 2020-10-09 NOTE — Discharge Instructions (Addendum)
The testing today does not show any serious problems.  For the abdominal pain we recommend using Tylenol or Motrin.  Also, heating pad is sometimes helpful.  Try to drink plenty water and gradually advance her diet to regular foods.  Your left ovarian cyst is smaller than previously.  It is important to get follow-up for that, and further care as needed for abdominal pain within the next 1 to 2 weeks.  Use the resource guide which is attached, to help you find a doctor.

## 2020-10-09 NOTE — ED Triage Notes (Signed)
C/o right upper quadrant abdominal pain that started Tuesday. Denies N/V/D. Hx of cholecystectomy. Unsure if she has her appendix or not

## 2020-10-20 ENCOUNTER — Ambulatory Visit (INDEPENDENT_AMBULATORY_CARE_PROVIDER_SITE_OTHER): Payer: Self-pay | Admitting: Obstetrics and Gynecology

## 2020-10-20 ENCOUNTER — Encounter: Payer: Self-pay | Admitting: Obstetrics and Gynecology

## 2020-10-20 ENCOUNTER — Telehealth: Payer: Self-pay

## 2020-10-20 ENCOUNTER — Other Ambulatory Visit: Payer: Self-pay

## 2020-10-20 VITALS — BP 162/102 | HR 96 | Ht 62.0 in | Wt 153.7 lb

## 2020-10-20 DIAGNOSIS — N83202 Unspecified ovarian cyst, left side: Secondary | ICD-10-CM

## 2020-10-20 DIAGNOSIS — N83209 Unspecified ovarian cyst, unspecified side: Secondary | ICD-10-CM | POA: Insufficient documentation

## 2020-10-20 DIAGNOSIS — R102 Pelvic and perineal pain unspecified side: Secondary | ICD-10-CM | POA: Insufficient documentation

## 2020-10-20 DIAGNOSIS — Z1331 Encounter for screening for depression: Secondary | ICD-10-CM

## 2020-10-20 DIAGNOSIS — R1900 Intra-abdominal and pelvic swelling, mass and lump, unspecified site: Secondary | ICD-10-CM

## 2020-10-20 DIAGNOSIS — Z5941 Food insecurity: Secondary | ICD-10-CM

## 2020-10-20 DIAGNOSIS — I159 Secondary hypertension, unspecified: Secondary | ICD-10-CM

## 2020-10-20 DIAGNOSIS — I10 Essential (primary) hypertension: Secondary | ICD-10-CM | POA: Insufficient documentation

## 2020-10-20 MED ORDER — IBUPROFEN 800 MG PO TABS: 800.0000 mg | ORAL_TABLET | Freq: Three times a day (TID) | ORAL | 1 refills | Status: AC | PRN

## 2020-10-20 NOTE — Progress Notes (Signed)
Pt scored 23 on PHQ-9/// 17 on the GAD7---

## 2020-10-20 NOTE — Telephone Encounter (Signed)
Called Pt using [place] Interpreter [place] id# [phone] to advise of U/S appt at [facility] on 10/31/20 @ 1:30p & for her to arrive at 1:15p, no answer, left VM.

## 2020-10-20 NOTE — Progress Notes (Signed)
CC: ovarian cyst follow up Subjective:    Patient ID: Carmen Peters, female    DOB: 1980-12-10, 40 y.o.   MRN: [id number]  HPI Pt returns for discussion of ovarian cyst.  Tumor markers show no increased risk of cancer.  Pt went to ER on 5/26 and had CT which showed decrease in size of cyst.  Pt still notes discomfort, but has only been taking OTC tylenol for pain.  She states the discomfort is making it difficult to go to work.  Discussed options including reassess for continued resolution of cyst in 6 weeks with ultrasound or moving forward with surgical intervention.  Pt desires reassessment in 6 weeks.  If cyst is the same size or larger will schedule surgery.  Pt also advised if pain becomes too much we can possibly move up the surgery date.  Pt has no insurance and has been advised to start paper work for [employer] assistance.  Elevated BP noted and pt will be referred to [facility] practice for management.   Review of Systems  Gastrointestinal: Positive for abdominal pain.       Objective:   Physical Exam Vitals:   10/20/20 1403 10/20/20 1404  BP: (!) 153/100 (!) 162/102  Pulse: 91 96   CLINICAL DATA:  Three day history of right upper quadrant pain   EXAM: CT ABDOMEN AND PELVIS WITHOUT CONTRAST   TECHNIQUE: Multidetector CT imaging of the abdomen and pelvis was performed following the standard protocol without IV contrast.   COMPARISON:  Prior CT abdomen/pelvis 07/18/2020   FINDINGS: Lower chest: No acute abnormality.   Hepatobiliary: No focal liver abnormality is seen. Status post cholecystectomy. No biliary dilatation.   Pancreas: Unremarkable. No pancreatic ductal dilatation or surrounding inflammatory changes.   Spleen: Normal in size without focal abnormality.   Adrenals/Urinary Tract: Adrenal glands are unremarkable. Kidneys are normal, without renal calculi, focal lesion, or hydronephrosis. Bladder is unremarkable.   Stomach/Bowel: No evidence of  obstruction or focal bowel wall thickening. Normal appendix in the right lower quadrant. The terminal ileum is unremarkable.   Vascular/Lymphatic: Limited evaluation in the absence of intravenous contrast. No significant atherosclerotic plaque. No evidence of suspicious lymphadenopathy.   Reproductive: Surgical changes of prior hysterectomy. Significantly decreased size of left ovarian cystic lesion now measuring 6.0 x 2.9 cm compared to 8.4 x 6.8 cm previously.   Other: No abdominal wall hernia or abnormality. No abdominopelvic ascites.   Musculoskeletal: No acute fracture or aggressive appearing lytic or blastic osseous lesion.   IMPRESSION: 1. No acute abnormality within the abdomen or pelvis. 2. Significantly decreased size of large left ovarian cyst compared to prior imaging from March of 2022. 3. Surgical changes of prior cholecystectomy and hysterectomy      Assessment & Plan:   1. Positive depression screening  - Ambulatory referral to [facility]  2. Food insecurity  - AMBULATORY REFERRAL TO [facility]  3. Pelvic pain in female  - ibuprofen (ADVIL) 800 MG tablet; Take 1 tablet (800 mg total) by mouth 3 (three) times daily with meals as needed for headache, moderate pain or cramping.  Dispense: 30 tablet; Refill: 1  4. Pelvic mass in female Recheck u/s in 6 weeks .  If same size or larger consider surgical intervention. Start financial assistance - US PELVIC COMPLETE WITH TRANSVAGINAL; Future  5. Secondary hypertension  - Ambulatory referral to [facility]  6. Cyst of left ovary Plan as above.  F/u in 7 weeks to discuss new  u/s findings  I spent 20 minutes dedicated to the care of this patient including previsit review of records, face to face time with the patient discussing radiologic findings, treatment plan and post visit testing.    Warden Fillers, MD Faculty Attending, Center for [facility]

## 2020-10-20 NOTE — Patient Instructions (Addendum)
Laparoscopa de diagnstico Diagnostic Laparoscopy La laparoscopa de diagnstico es un procedimiento que se realiza para diagnosticar problemas en el abdomen. Podra realizarse por diversos motivos, como buscar tejido cicatricial, el motivo de un dolor en el abdomen, una masa abdominal o tumor o lquido en el abdomen (ascitis). Este procedimiento tambin puede realizarse para extraer Lauris Poag de tejido del hgado para observarla bajo un microscopio (biopsia). Durante el procedimiento, se inserta un tubo delgado y flexible que tiene una luz y una cmara en el extremo (laparoscopio) a travs de una pequea incisin en el abdomen. La imagen de la cmara se [place] en un monitor para ayudar al cirujano a ver dentro del cuerpo. Informe al mdico acerca de lo siguiente:  Cualquier alergia que tenga.  Todos los [employer] [place], incluidos vitaminas, hierbas, gotas oftlmicas, cremas y [address] de [address].  Problemas previos que usted o algn miembro de su familia hayan tenido con los anestsicos.  Cualquier trastorno de la sangre que tenga.  Cirugas a las que se haya sometido.  Cualquier afeccin mdica que tenga.  Si est embarazada o podra estarlo. Cules son los riesgos? En general, se trata de un procedimiento seguro. Sin embargo, pueden ocurrir complicaciones, por ejemplo:  Infeccin.  Sangrado.  Reacciones alrgicas a los medicamentos o a los tintes.  Lesiones en estructuras u rganos abdominales, como los intestinos, el hgado, el estmago o el bazo. Qu ocurre antes del procedimiento? Mantenerse hidratado Siga las instrucciones del mdico acerca de mantenerse hidratado, las cuales pueden incluir lo siguiente:  [employer] horas antes del procedimiento, puede beber lquidos transparentes, como agua, jugos de fruta sin pulpa, caf negro y t solo.   Restricciones en las comidas y bebidas Siga las instrucciones del mdico respecto de las restricciones de comidas o  bebidas, las cuales pueden incluir lo siguiente:  Ocho horas antes del procedimiento, deje de ingerir comidas o alimentos pesados, como carne, alimentos fritos o alimentos grasos.  Seis horas antes del procedimiento, deje de ingerir comidas o alimentos livianos, como tostadas o cereales.  Seis horas antes del procedimiento, deje de beber [place] o bebidas que [employer].  Dos horas antes del procedimiento, deje de beber lquidos transparentes. Medicamentos Consulte al mdico sobre:  Multimedia programmer o suspender los medicamentos que [place] habitualmente. Esto es muy importante si toma medicamentos para la diabetes o anticoagulantes.  Tomar medicamentos como aspirina e ibuprofeno. Estos medicamentos pueden tener un efecto anticoagulante en la [place]. No tome estos medicamentos a menos que el mdico se lo indique.  Usar medicamentos de venta libre, vitaminas, hierbas y suplementos. Instrucciones generales  Pregntele al mdico: ? Cmo se Forensic psychologist de la ciruga. ? Qu medidas se tomarn para evitar una infeccin. Estas medidas pueden incluir las siguientes:  Rasurar el vello del lugar de la [place].  Lavar la piel con un jabn antisptico.  Recibir antibiticos.  Pida que un adulto responsable lo lleve a su casa desde el hospital o la clnica.  Haga que un adulto responsable lo cuide durante el tiempo que le indiquen despus de que le den el alta del hospital o de la clnica. Esto es importante. Qu ocurre durante el procedimiento?  Le colocarn una va intravenosa en una vena.  Le administrarn uno o ms de los siguientes medicamentos: ? Un medicamento para ayudar a relajarse (sedante). ? Un medicamento para adormecer la zona (anestesia local). ? Un medicamento que lo har dormir (anestesia general).  Le colocarn un tubo que pasar por la garganta para  que pueda respirar durante el procedimiento.  El abdomen se llena de un gas similar al aire para expandirlo. Esto permitir  que el cirujano tenga ms espacio para operar y Research officer, political party visualizacin de los rganos.  Le practicarn varias incisiones pequeas en el abdomen.  A travs de estas incisiones, se introducirn un laparoscopio y otros instrumentos quirrgicos en el abdomen.  Tambin se podr Magazine features editor biopsia. Esto depender del motivo por el cual se le est realizando este procedimiento.  Se retirarn del abdomen el laparoscopio y los dems instrumentos.  Se le extraer el gas similar al aire del abdomen.  Las incisiones se cerrarn con puntos (suturas), goma para cerrar la piel o cintas quirrgicas, y se cubrirn con una venda (vendaje).  Le quitarn el tubo de respiracin. Este procedimiento puede variar segn el mdico y el hospital.   Ladell Heads ocurre despus del procedimiento?  Le controlarn la presin arterial, la frecuencia cardaca, la frecuencia respiratoria y Air cabin crew de oxgeno en la sangre hasta que le den el alta del hospital o la clnica.  Si le administraron un sedante durante el procedimiento, puede afectarlo por varias horas. No conduzca ni opere maquinaria hasta que el mdico le indique que es seguro [place].  Es su responsabilidad retirar [employer] del procedimiento. Pregntele a su mdico, o a un miembro del personal del departamento donde se realice el procedimiento, cundo estarn [employer]. Resumen  La laparoscopia diagnstica es un procedimiento para Customer service manager en el abdomen mediante el uso de un tubo delgado y flexible que tiene una luz y una cmara en el extremo (laparoscopio).  Siga las instrucciones del mdico acerca de cmo prepararse para este procedimiento.  Haga que un adulto responsable lo cuide durante el tiempo que le indiquen despus de que le den el alta del hospital o de la clnica. Esto es importante. Esta informacin no tiene Theme park manager el consejo del mdico. Asegrese de hacerle al mdico cualquier pregunta que  tenga. Document Revised: 02/27/2020 Document Reviewed: 02/27/2020 Elsevier Patient Education  2021 Elsevier Inc.  Quiste ovrico Ovarian Cyst  Un quiste ovrico es una bolsa llena de lquido que se forma en el ovario. En su mayora, estos quistes desaparecen solos y no son cancerosos. Otros quistes necesitan tratamiento. Cules son las causas?  Sndrome de hiperestimulacin ovrica. Algunos medicamentos pueden causar este problema.  Sndrome del ovario poliqustico (SOP). Problemas con las sustancias qumicas del cuerpo (hormonas) pueden causar esta afeccin.  El ciclo menstrual normal. Qu incrementa el riesgo?  Tener sobrepeso u obesidad.  Tomar medicamentos para aumentar la probabilidad de [place].  Usar algunos tipos de mtodos anticonceptivos.  Fumar. Cules son los signos o sntomas? Muchos quistes ovricos no causan sntomas. Si tiene sntomas, pueden incluir lo siguiente:  Dolor o presin en la zona [employer] de la cadera.  Dolor en el abdomen bajo.  Dolor al [employer].  Hinchazn en el abdomen bajo.  Perodos menstruales irregulares.  Dolor durante los perodos [place]. Cmo se trata? Muchos de los quistes ovricos desaparecen por s solos, sin TEFL teacher. Si necesita tratamiento, este puede incluir lo siguiente:  Analgsicos.  Extraccin del lquido del quiste.  Extirpacin del quiste.  Pldoras anticonceptivas u otros medicamentos.  Ciruga para extirpar el ovario. Siga estas instrucciones en su casa:  Use los medicamentos de venta libre y los recetados solamente como se lo haya indicado el mdico.  Pregunte al mdico si debe evitar conducir o Chemical engineer mquinas mientras toma los medicamentos.  Asegrese de realizarse exmenes y la prueba de Papanicolaou como se lo haya indicado el mdico.  Regrese a sus actividades normales cuando el mdico le diga que es seguro.  No fume ni consuma ningn producto que contenga  nicotina o tabaco. Si necesita ayuda para dejar de consumir estos productos, consulte al mdico.  Cumpla con todas las visitas de seguimiento. Comunquese con un mdico si:  Los perodos menstruales: ? Se retrasan. ? No son regulares. ? Cesan. ? Son dolorosos.  Siente dolor en la zona [employer] de la cadera, y Chief Technology Officer no se va.  Siente presin en la vejiga.  Tiene dificultad para orinar.  Se siente satisfecha, o el abdomen le duele, se inflama o se hincha.  Sube o baja de peso sin proponrselo o tiene menos apetito de lo normal.  Siente dolor y opresin en la espalda.  Siente dolor y presin en la zona entre los huesos de la cadera.  Cree que puede estar [place]. Solicite ayuda de inmediato si:  Tiene un dolor muy intenso en el abdomen que empeora.  Siente dolor en la zona [employer] de la cadera, y Chief Technology Officer es muy intenso o [place].  No puede comer ni beber sin vomitar.  Tiene fiebre o escalofros repentinamente.  Los perodos menstruales son ms abundantes de lo habitual. Resumen  Un quiste ovrico es una bolsa llena de lquido que se forma en el ovario.  Algunos quistes pueden causar problemas y Network engineer.  En su Gwendolyn Fill, suelen desaparecer solos. Esta informacin no tiene Theme park manager el consejo del mdico. Asegrese de hacerle al mdico cualquier pregunta que tenga. Document Revised: 12/04/2019 Document Reviewed: 12/04/2019 Elsevier Patient Education  2021 [employer].

## 2020-10-21 ENCOUNTER — Ambulatory Visit: Payer: Self-pay

## 2020-10-21 NOTE — BH Specialist Note (Signed)
Pt did not arrive to video visit and did not answer the phone; Left HIPPA-compliant message to call back Dream Harman from [facility] at  [phone] (Zaahir Pickney's office).  ?; left MyChart message for patient.  ? ?

## 2020-10-30 ENCOUNTER — Other Ambulatory Visit: Payer: Self-pay

## 2020-10-30 ENCOUNTER — Ambulatory Visit: Payer: Self-pay | Admitting: *Deleted

## 2020-10-30 ENCOUNTER — Ambulatory Visit
Admission: RE | Admit: 2020-10-30 | Discharge: 2020-10-30 | Disposition: A | Discharge status: Home / Self Care | Payer: No Typology Code available for payment source | Source: Ambulatory Visit | Attending: Obstetrics and Gynecology | Admitting: Obstetrics and Gynecology

## 2020-10-30 VITALS — BP 158/98 | Wt 154.5 lb

## 2020-10-30 DIAGNOSIS — Z1239 Encounter for other screening for malignant neoplasm of breast: Secondary | ICD-10-CM

## 2020-10-30 DIAGNOSIS — Z1231 Encounter for screening mammogram for malignant neoplasm of breast: Secondary | ICD-10-CM

## 2020-10-30 NOTE — Patient Instructions (Signed)
Explained breast self awareness with Carmen Peters. Patient did not need a Pap smear today due to patient has a history of a hysterectomy for benign reasons. Let patient know that she doesn't need any further Pap smears due to her history of a hysterectomy for benign reasons. Referred patient to the [facility] for a screening mammogram on mobile unit. Appointment scheduled Thursday, October 30, 2020 at 1510. Patient escorted to the mobile unit following BCCCP appointment for her screening mammogram. Let patient know the Breast Center will follow up with her within the next couple weeks with results of her mammogram by letter or phone. Carmen Peters verbalized understanding.  Carmen Peters, Kathaleen Maser, RN 2:35 PM

## 2020-10-30 NOTE — Progress Notes (Signed)
Ms. Carmen Peters is a 40 y.o. female who presents to [facility] clinic today with no complaints.    Pap Smear: Pap smear not completed today. Last Pap smear was 07/02/2004 at [facility] clinic and was normal. Per patient has no history of an abnormal Pap smear. Patient has a history of a hysterectomy 03/04/2008 due to fibroids and AUB. Patient doesn't need any further Pap smears due to her history of a hysterectomy for benign reasons per BCCCP and ASCCP guidelines. Last Pap smear result is available in Epic.   Physical exam: Breasts Breasts symmetrical. No skin abnormalities bilateral breasts. No nipple retraction bilateral breasts. No nipple discharge bilateral breasts. No lymphadenopathy. No lumps palpated bilateral breasts. No complaints of pain or tenderness on exam.   Pelvic/Bimanual Pap is not indicated today per BCCCP guidelines.   Smoking History: Patient has never smoked.    Patient Navigation: Patient education provided. Access to services provided for patient through [employer] program. Spanish interpreter Alene Mires from [facility] provided. Bag of food from food pantry given to patient.    Breast and Cervical Cancer Risk Assessment: Patient does not have family history of breast cancer, known genetic mutations, or radiation treatment to the chest before age 62. Patient does not have history of cervical dysplasia, immunocompromised, or DES exposure in-utero.  Risk Assessment     Risk Scores       10/30/2020   Last edited by: Meryl Dare, CMA   5-year risk: 0.4 %   Lifetime risk: 6.3 %            A: BCCCP exam without pap smear No complaints.  P: Referred patient to the [facility] for a screening mammogram on mobile unit. Appointment scheduled Thursday, October 30, 2020 at 1510.  Priscille Heidelberg, RN 10/30/2020 2:34 PM

## 2020-10-31 ENCOUNTER — Ambulatory Visit (HOSPITAL_COMMUNITY)
Admission: RE | Admit: 2020-10-31 | Discharge: 2020-10-31 | Disposition: A | Discharge status: Home / Self Care | Payer: No Typology Code available for payment source | Source: Ambulatory Visit | Attending: Obstetrics and Gynecology | Admitting: Obstetrics and Gynecology

## 2020-10-31 DIAGNOSIS — R1900 Intra-abdominal and pelvic swelling, mass and lump, unspecified site: Secondary | ICD-10-CM | POA: Insufficient documentation

## 2020-11-03 ENCOUNTER — Telehealth: Payer: Self-pay

## 2020-11-03 ENCOUNTER — Ambulatory Visit: Payer: Self-pay | Admitting: Clinical

## 2020-11-03 ENCOUNTER — Other Ambulatory Visit: Payer: Self-pay | Admitting: Obstetrics and Gynecology

## 2020-11-03 DIAGNOSIS — R928 Other abnormal and inconclusive findings on diagnostic imaging of breast: Secondary | ICD-10-CM

## 2020-11-03 DIAGNOSIS — Z91199 Patient's noncompliance with other medical treatment and regimen due to unspecified reason: Secondary | ICD-10-CM

## 2020-11-03 NOTE — Telephone Encounter (Addendum)
-----   Message from Warden Fillers, MD sent at 11/03/2020 12:57 PM EDT ----- From March to June there has been a dramatic decrease in size of the cyst seen previously.  There is also a 2 cm probable hemorrhagic cyst.  Surgery is not recommended at this time.  No free fluid.  Pt can follow up PRN is pain does not improve  Called pt with Spanish Interpreter Raquel M. And informed pt provider's recommendation.  Pt states that she has an appt with the provider on 12/03/20 and asked if she should keep it.  I advised the pt that she can keep her appt if she would or cancel it is her choice if she wants to call the office.  Pt verbalized understanding.   Addison Naegeli, RN  11/03/20

## 2020-11-13 ENCOUNTER — Other Ambulatory Visit: Payer: Self-pay

## 2020-11-13 ENCOUNTER — Ambulatory Visit: Payer: No Typology Code available for payment source

## 2020-11-13 ENCOUNTER — Ambulatory Visit
Admission: RE | Admit: 2020-11-13 | Discharge: 2020-11-13 | Disposition: A | Discharge status: Home / Self Care | Payer: No Typology Code available for payment source | Source: Ambulatory Visit | Attending: Obstetrics and Gynecology | Admitting: Obstetrics and Gynecology

## 2020-11-13 DIAGNOSIS — R928 Other abnormal and inconclusive findings on diagnostic imaging of breast: Secondary | ICD-10-CM

## 2020-11-24 ENCOUNTER — Inpatient Hospital Stay: Payer: Self-pay | Attending: Obstetrics and Gynecology | Admitting: *Deleted

## 2020-11-24 ENCOUNTER — Other Ambulatory Visit: Payer: Self-pay

## 2020-11-24 VITALS — BP 158/100 | Ht 61.0 in | Wt 151.5 lb

## 2020-11-24 DIAGNOSIS — Z0000 Encounter for general adult medical examination without abnormal findings: Secondary | ICD-10-CM

## 2020-11-24 NOTE — Progress Notes (Signed)
Wisewoman initial screening   Interpreter- Natale Lay, [employer]   Clinical Measurement: There were no vitals filed for this visit. Fasting Labs Drawn Today, will review with patient when they result.   Medical History:  Patient states that she  does not know if she has  high cholesterol, has high blood pressure and she  does not know if she has  diabetes.  Medications:  Patient states that she does not take medication to lower cholesterol, blood pressure or blood sugar.  Patient does not take an aspirin a day to help prevent a heart attack or stroke.    Blood pressure, self measurement:  Patient states that she does not have the equipment to measure blood pressure from home. She checks her blood pressure N/A. She shares her readings with a health care provider: N/A.   Nutrition: Patient states that on average she eats 1 cups of fruit and 0 cups of vegetables per day. Patient states that she does not eat fish at least 2 times per week. Patient eats less than half servings of whole grains. Patient drinks less than 36 ounces of beverages with added sugar weekly: no. Patient is currently watching sodium or salt intake: yes. In the past 7 days patient has consumed drinks containing alcohol on 0 days. On a day that patient consumes drinks containing alcohol on average 0 drinks are consumed.      Physical activity:  Patient states that she gets 0 minutes of moderate and 0 minutes of vigorous physical activity each week.  Smoking status:  Patient states that she has has never smoked .   Quality of life:  Over the past 2 weeks patient states that she had little interest or pleasure in doing things: several days. She has been feeling down, depressed or hopeless:not at all.    Risk reduction and counseling:   Health Coaching: Spoke with patient about the daily recommendation for fruits and vegetables. Explained that the recommendation is for 2 cups of fruit and 3 cups of vegetables daily. Showed patient  what one serving would look like. Patient currently does not consume fish often. Encouraged patient to try and increase fish intake to at least one serving a week. Gave recommendations for different heart healthy fish that she can try such as salmon, tuna, mackerel, sardines, sea bass or trout. Patient eats oatmeal daily. Gave recommendations for whole wheat bread, whole wheat pasta, brown rice, oatmeal, or whole grain cereals. Encouraged patient to continue watching salt intake. Spoke with patient about increasing daily exercising to 20 minutes. Patient currently consumes 1 soda a day. Explained that the recommendation is for 36 ounces or less per week.   HeartWise:  Explained Heart Wise program to patient. Patient agreed to participate in new telehealth pilot. Showed patient how to check blood pressure using monitor and weight using scale. Patient then demonstrated how to check her blood pressure and weight. Went over blood pressure tracking logs with patient. Will check in with patient on 12/10/2020 to get first two weeks of blood pressure readings. Gave patient educational resource brochures on cholesterol, blood pressure and nutrition. Answered any questions that patient had regarding hypertension and blood pressure monitoring.    Navigation:  I will notify patient of lab results.  Patient is aware of 2 more health coaching sessions and a follow up. Will call patient with follow-up appointment information for Internal Medicine once appointment is scheduled. Referred patient to the food market at [employer] for a shopping experience.  Time: 30 minutes

## 2020-11-25 LAB — LIPID PANEL
Chol/HDL Ratio: 5.7 ratio — ABNORMAL HIGH (ref 0.0–4.4)
Cholesterol, Total: 238 mg/dL — ABNORMAL HIGH (ref 100–199)
HDL: 42 mg/dL (ref >39)
LDL Chol Calc (NIH): 146 mg/dL — ABNORMAL HIGH (ref 0–99)
Triglycerides: 272 mg/dL — ABNORMAL HIGH (ref 0–149)
VLDL Cholesterol Cal: 50 mg/dL — ABNORMAL HIGH (ref 5–40)

## 2020-11-25 LAB — HEMOGLOBIN A1C
Est. average glucose Bld gHb Est-mCnc: 114 mg/dL
Hgb A1c MFr Bld: 5.6 % (ref 4.8–5.6)

## 2020-11-25 LAB — GLUCOSE, RANDOM: Glucose: 95 mg/dL (ref 65–99)

## 2020-12-01 ENCOUNTER — Telehealth: Payer: Self-pay

## 2020-12-01 NOTE — Telephone Encounter (Signed)
Health coaching 2   interpreter- Natale Lay, [facility]   Labs-238 cholesterol, 146 LDL cholesterol, 272 triglycerides 272, 42 HDL cholesterol, 5.6 hemoglobin A1C, 95 mean plasma glucose.  Patient understands and is aware of her lab results.   Goals-  1. Reduce the amount of fried foods consumed. Try to grill, bake, broil or sautee foods instead. 2. Reduce the amount of red meat consumed. Substitute for lean protein like chicken, fish or [place]. 3. Reduce the amount of whole fat dairy consumed. Look for low-fat or reduced fat options instead. 4. Increase the amount of whole grains and heart healthy fish consumed. 5. Start exercising for 20-30 minutes daily.   Explained to patient that glucose and hemoglobin A1C were both normal but were at the high end of normal. Encouraged patient to start watching the amount of sweets and sugars consumed in both food and drink as well as the amount of carbs consumed.   Navigation:  Patient is aware of 1 more health coaching sessions and a follow up. Will call patient with follow-up appointment information for Internal Medicine once appointment is scheduled for elevated BP and labs.  Time-  16 minutes

## 2020-12-03 ENCOUNTER — Other Ambulatory Visit: Payer: Self-pay

## 2020-12-03 ENCOUNTER — Ambulatory Visit (INDEPENDENT_AMBULATORY_CARE_PROVIDER_SITE_OTHER): Payer: Self-pay | Admitting: Obstetrics and Gynecology

## 2020-12-03 VITALS — BP 151/101 | HR 77 | Wt 151.5 lb

## 2020-12-03 DIAGNOSIS — N83202 Unspecified ovarian cyst, left side: Secondary | ICD-10-CM

## 2020-12-03 NOTE — Progress Notes (Signed)
  CC: ultrasound follow up Subjective:    Patient ID: Carmen Peters, female    DOB: November 14, 1980, 40 y.o.   MRN: [id number]  HPI Pt was seen s/p her ultrasound.  She states she feels much better now.  Pt does appear in good spirits.  Discussed continued resolution/decrease in size of left ovarian cyst.  Ultrasound findigns discussed in detail.   Review of Systems     Objective:   Physical Exam Vitals:   12/03/20 1349 12/03/20 1350  BP: (!) 153/93 (!) 151/101  Pulse: 77 77   CLINICAL DATA:  Follow-up of left ovarian cyst.   EXAM: TRANSABDOMINAL AND TRANSVAGINAL ULTRASOUND OF PELVIS   TECHNIQUE: Both transabdominal and transvaginal ultrasound examinations of the pelvis were performed. Transabdominal technique was performed for global imaging of the pelvis including uterus, ovaries, adnexal regions, and pelvic cul-de-sac. It was necessary to proceed with endovaginal exam following the transabdominal exam to visualize the adnexal structures.   COMPARISON:  Pelvic ultrasound 07/18/2020   FINDINGS: Uterus   Surgically absent   Right ovary   Surgically absent   Left ovary   Measurements: 4.1 x 5.5 x 5.2 cm = volume: 61.4 mL. There is a 2.8 x 2.6 x 2.1 cm cyst within the left ovary. This is decreased in size when compared to prior ultrasound examination. Additionally there is a probable small hemorrhagic cyst within the left ovary measuring approximately 2 cm.   Other findings   No abnormal free fluid.   IMPRESSION: Interval decrease in size of cyst within the left ovary.   Additionally there is a probable 2 cm hemorrhagic cyst within the left ovary.   Short-interval follow up ultrasound in 6-12 weeks is recommended to reassess the left ovarian cyst and probable hemorrhagic cyst, preferably during the week following the patient's normal menses.      Assessment & Plan:   1. Cyst of left ovary Continued resolution of left ovarian cyst.  Pt has no restrictions.   Advised repeat scan in 4 months. Pt advised to follow up with PCP regarding her elevated blood pressure.  - US PELVIC COMPLETE WITH TRANSVAGINAL; Future   I spent 10 minutes dedicated to the care of this patient including previsit review of records, face to face time with the patient discussing ultrasound findings and post visit testing.    Warden Fillers, MD Faculty Attending, [facility] for [facility]

## 2020-12-04 ENCOUNTER — Telehealth: Payer: Self-pay

## 2020-12-04 NOTE — Telephone Encounter (Signed)
Called patient via [employer] [phone] to give follow-up appointment information for [facility] labs. Patient is scheduled with Internal Medicine on 12/17/20 @ 2:45 pm. Left appointment information on patient's voicemail at patient's request. Left name and number for patient to call back if she has any questions.

## 2020-12-17 ENCOUNTER — Encounter: Payer: No Typology Code available for payment source | Admitting: Internal Medicine

## 2020-12-17 NOTE — Progress Notes (Deleted)
   CC: hypertension  HPI:[name] is a 40 y.o. female who presents for evaluation of hypertension and cholesterol check. Please see individual problem based A/P for details.  HTN - up to 160s systolic, 100's diastolic - *** do you check your blood pressure at home? - *** family history? - *** high sodium diet? - *** excessive alcohol consumption? - *** tobacco use? - *** other drug use? - *** physical inactivity - *** oral contraceptives? - *** decongestants?  -   Cholesterol  Pap smear - f/u with OBGYN  Hepatitis C screening  Depression, PHQ-9: Based on the patients  Flowsheet Row [facility]  PHQ-9 Total Score 23      score we have ***.  No past medical history on file. Review of Systems:   ROS   Physical Exam: There were no vitals filed for this visit.   General: *** HEENT: Conjunctiva nl , antiicteric sclerae, moist mucous membranes, no exudate or erythema Cardiovascular: Normal rate, regular rhythm.  No murmurs, rubs, or gallops Pulmonary : Equal breath sounds, No wheezes, rales, or rhonchi Abdominal: soft, nontender,  bowel sounds present Ext: No edema in lower extremities, no tenderness to palpation of lower extremities.   Assessment & Plan:   See Encounters Tab for problem based charting.  Patient {GC/GE:[id number]::"discussed with","seen with"} Dr. {DPOEU:[id number]::"E. Hoffman","Guilloud","Mullen","Narendra","Raines","Vincent","Williams"}

## 2021-02-25 ENCOUNTER — Telehealth: Payer: Self-pay

## 2021-02-25 NOTE — Telephone Encounter (Signed)
Called patient via Carmen Peters to obtain BP readings. Patient was at work and asked [place] to call her back today after 12:30. Will call patient back at requested time.

## 2021-03-02 ENCOUNTER — Telehealth: Payer: Self-pay

## 2021-03-02 NOTE — Telephone Encounter (Signed)
Attempted to call patient again to get BP readings but was unable to reach patient.   Attempted to call on the previous dates as well 12/15/20, 12/16/20,  12/17/20, 12/25/20, 02/25/21, 02/26/21 but have been unable to reach patient. Patient is considered lost-to-follow-up at this time.

## 2021-11-06 ENCOUNTER — Encounter (HOSPITAL_COMMUNITY): Payer: Self-pay

## 2021-11-06 ENCOUNTER — Emergency Department (HOSPITAL_COMMUNITY)
Admission: EM | Admit: 2021-11-06 | Discharge: 2021-11-06 | Disposition: A | Discharge status: Home / Self Care | Payer: No Typology Code available for payment source | Attending: Emergency Medicine | Admitting: Emergency Medicine

## 2021-11-06 DIAGNOSIS — I10 Essential (primary) hypertension: Secondary | ICD-10-CM | POA: Insufficient documentation

## 2021-11-06 DIAGNOSIS — R739 Hyperglycemia, unspecified: Secondary | ICD-10-CM | POA: Insufficient documentation

## 2021-11-06 DIAGNOSIS — R519 Headache, unspecified: Secondary | ICD-10-CM

## 2021-11-06 LAB — CBC WITH DIFFERENTIAL/PLATELET
Abs Immature Granulocytes: 0.03 10*3/uL (ref 0.00–0.07)
Basophils Absolute: 0 10*3/uL (ref 0.0–0.1)
Basophils Relative: 1 %
Eosinophils Absolute: 0.1 10*3/uL (ref 0.0–0.5)
Eosinophils Relative: 2 %
HCT: 39.3 % (ref 36.0–46.0)
Hemoglobin: 13.3 g/dL (ref 12.0–15.0)
Immature Granulocytes: 0 %
Lymphocytes Relative: 24 %
Lymphs Abs: 2 10*3/uL (ref 0.7–4.0)
MCH: 30.9 pg (ref 26.0–34.0)
MCHC: 33.8 g/dL (ref 30.0–36.0)
MCV: 91.2 fL (ref 80.0–100.0)
Monocytes Absolute: 0.6 10*3/uL (ref 0.1–1.0)
Monocytes Relative: 7 %
Neutro Abs: 5.6 10*3/uL (ref 1.7–7.7)
Neutrophils Relative %: 66 %
Platelets: 206 10*3/uL (ref 150–400)
RBC: 4.31 MIL/uL (ref 3.87–5.11)
RDW: 12.2 % (ref 11.5–15.5)
WBC: 8.4 10*3/uL (ref 4.0–10.5)
nRBC: 0 % (ref 0.0–0.2)

## 2021-11-06 LAB — COMPREHENSIVE METABOLIC PANEL
ALT: 25 U/L (ref 0–44)
AST: 21 U/L (ref 15–41)
Albumin: 3.8 g/dL (ref 3.5–5.0)
Alkaline Phosphatase: 44 U/L (ref 38–126)
Anion gap: 8 (ref 5–15)
BUN: 13 mg/dL (ref 6–20)
CO2: 24 mmol/L (ref 22–32)
Calcium: 9 mg/dL (ref 8.9–10.3)
Chloride: 106 mmol/L (ref 98–111)
Creatinine, Ser: 0.76 mg/dL (ref 0.44–1.00)
GFR, Estimated: >60 mL/min (ref >60)
Glucose, Bld: 135 mg/dL — ABNORMAL HIGH (ref 70–99)
Potassium: 3.8 mmol/L (ref 3.5–5.1)
Sodium: 138 mmol/L (ref 135–145)
Total Bilirubin: 1.1 mg/dL (ref 0.3–1.2)
Total Protein: 7.5 g/dL (ref 6.5–8.1)

## 2021-11-06 LAB — URINALYSIS, ROUTINE W REFLEX MICROSCOPIC
Bilirubin Urine: NEGATIVE
Glucose, UA: NEGATIVE mg/dL
Hgb urine dipstick: NEGATIVE
Ketones, ur: NEGATIVE mg/dL
Leukocytes,Ua: NEGATIVE
Nitrite: NEGATIVE
Protein, ur: 30 mg/dL — AB
Specific Gravity, Urine: 1.011 (ref 1.005–1.030)
pH: 6 (ref 5.0–8.0)

## 2021-11-06 MED ORDER — KETOROLAC TROMETHAMINE 30 MG/ML IJ SOLN
15.0000 mg | Freq: Once | INTRAMUSCULAR | Status: AC
  Administered 2021-11-06: 15 mg via INTRAVENOUS
  Filled 2021-11-06: qty 1

## 2021-11-06 MED ORDER — SODIUM CHLORIDE 0.9 % IV BOLUS
1000.0000 mL | Freq: Once | INTRAVENOUS | Status: AC
  Administered 2021-11-06: 1000 mL via INTRAVENOUS

## 2021-11-06 MED ORDER — PROCHLORPERAZINE EDISYLATE 10 MG/2ML IJ SOLN
10.0000 mg | Freq: Once | INTRAMUSCULAR | Status: AC
  Administered 2021-11-06: 10 mg via INTRAVENOUS
  Filled 2021-11-06: qty 2

## 2021-11-06 NOTE — ED Provider Notes (Signed)
[facility]-EMERGENCY DEPT Provider Note   CSN: [id number] Arrival date & time: 11/06/21  1202     History  Chief Complaint  Patient presents with   Headache    Carmen Peters is a 41 y.o. female.  HPI Patient presents with her daughter who is available for assistance with translation as needed.  Patient presents with headache since yesterday.  She notes yesterday she had a event at work during which she feels as though she was mistreated.  Since that time she has had diffuse headache, not improved with ibuprofen.  Patient notes a history of hypertension which she has been taking lisinopril, no recent change in medication, diet, activity otherwise. No weakness in her extremities, though she does have some occasional pain in her legs.    Home Medications Prior to Admission medications   Medication Sig Start Date End Date Taking? Authorizing Provider  acetaminophen (TYLENOL) 500 MG tablet Take 1 tablet (500 mg total) by mouth every 6 (six) hours as needed. Patient not taking: No sig reported 08/02/14   Piepenbrink, Victorino Dike, PA-C  cephALEXin (KEFLEX) 500 MG capsule Take 1 capsule (500 mg total) by mouth 4 (four) times daily. Patient not taking: No sig reported 08/15/16   Liberty Handy, PA-C  famotidine (PEPCID) 20 MG tablet Take 1 tablet (20 mg total) by mouth 2 (two) times daily. Patient not taking: No sig reported 04/20/13   Earley Favor, NP  HYDROcodone-acetaminophen (NORCO/VICODIN) 5-325 MG tablet Take 1 tablet by mouth every 6 (six) hours as needed for severe pain. Patient not taking: No sig reported 07/18/20   Caccavale, Sophia, PA-C  ibuprofen (ADVIL) 800 MG tablet Take 1 tablet (800 mg total) by mouth 3 (three) times daily with meals as needed for headache, moderate pain or cramping. Patient not taking: No sig reported 10/20/20   Warden Fillers, MD  naproxen (NAPROSYN) 500 MG tablet Take 1 tablet (500 mg total) by mouth 2 (two) times daily. Patient not  taking: No sig reported 07/18/20   Caccavale, Sophia, PA-C  triamcinolone cream (KENALOG) 0.5 % Apply topically 2 (two) times daily. To skin lesions Patient not taking: No sig reported 04/20/13   Earley Favor, NP      Allergies    Patient has no known allergies.    Review of Systems   Review of Systems  All other systems reviewed and are negative.   Physical Exam Updated Vital Signs BP 132/83   Pulse 78   Temp 98.5 F (36.9 C) (Oral)   Resp 18   SpO2 100%  Physical Exam Vitals and nursing note reviewed.  Constitutional:      General: She is not in acute distress.    Appearance: She is well-developed.  HENT:     Head: Normocephalic and atraumatic.  Eyes:     Conjunctiva/sclera: Conjunctivae normal.  Cardiovascular:     Rate and Rhythm: Normal rate and regular rhythm.  Pulmonary:     Effort: Pulmonary effort is normal. No respiratory distress.     Breath sounds: Normal breath sounds. No stridor.  Abdominal:     General: There is no distension.  Skin:    General: Skin is warm and dry.  Neurological:     Mental Status: She is alert and oriented to person, place, and time.     Cranial Nerves: No cranial nerve deficit, dysarthria or facial asymmetry.     Motor: No weakness.  Psychiatric:        Mood and Affect:  Mood normal.     ED Results / Procedures / Treatments   Labs (all labs ordered are listed, but only abnormal results are displayed) Labs Reviewed  COMPREHENSIVE METABOLIC PANEL - Abnormal; Notable for the following components:      Result Value   Glucose, Bld 135 (*)    All other components within normal limits  URINALYSIS, ROUTINE W REFLEX MICROSCOPIC - Abnormal; Notable for the following components:   APPearance HAZY (*)    Protein, ur 30 (*)    Bacteria, UA RARE (*)    All other components within normal limits  CBC WITH DIFFERENTIAL/PLATELET    EKG None  Radiology No results found.  Procedures Procedures    Medications Ordered in  ED Medications  sodium chloride 0.9 % bolus 1,000 mL (1,000 mLs Intravenous New Bag/Given 11/06/21 1326)  ketorolac (TORADOL) 30 MG/ML injection 15 mg (15 mg Intravenous Given 11/06/21 1321)  prochlorperazine (COMPAZINE) injection 10 mg (10 mg Intravenous Given 11/06/21 1320)    ED Course/ Medical Decision Making/ A&P This patient with a Hx of obesity, hypertension presents to the ED for concern of new headache, this involves an extensive number of treatment options, and is a complaint that carries with it a high risk of complications and morbidity.    The differential diagnosis includes stress-induced headache, hypertensive crisis, less likely intracranial abnormality given the preserved neurologic status   Social Determinants of Health:  BC, Spanish-speaking  Additional history obtained:  Additional history and/or information obtained from daughter, notable for HPI inclusion   After the initial evaluation, orders, including: Labs Compazine fluids Toradol were initiated.   Patient placed on Cardiac and Pulse-Oximetry Monitors. The patient was maintained on a cardiac monitor.  The cardiac monitored showed an rhythm of 80 sinus normal The patient was also maintained on pulse oximetry. The readings were typically 100% room air normal   On repeat evaluation of the patient improved 3:09 PM Patient awake, alert, ambulatory, smiling, states that she feels better.   Lab Tests:  I personally interpreted labs.  The pertinent results include: Hyperglycemia  Dispostion / Final MDM:  After consideration of the diagnostic results and the patient's response to treatment, patient is improved markedly with fluids, seen Toradol, has unremarkable labs has a benign neurologic exam no evidence for acute intracranial phenomena.  She is hemodynamically unremarkable, no leukocytosis low suspicion for infectious etiology.  Given her resolution of symptoms here patient discharged in stable condition to  follow-up as an outpatient.  Final Clinical Impression(s) / ED Diagnoses Final diagnoses:  Bad headache     Gerhard Munch, MD 11/06/21 [phone]

## 2023-08-06 ENCOUNTER — Other Ambulatory Visit: Payer: Self-pay

## 2023-08-06 ENCOUNTER — Emergency Department (HOSPITAL_COMMUNITY)

## 2023-08-06 ENCOUNTER — Emergency Department (HOSPITAL_COMMUNITY)
Admission: EM | Admit: 2023-08-06 | Discharge: 2023-08-06 | Disposition: A | Discharge status: Home / Self Care | Attending: Emergency Medicine | Admitting: Emergency Medicine

## 2023-08-06 DIAGNOSIS — W07XXXA Fall from chair, initial encounter: Secondary | ICD-10-CM | POA: Insufficient documentation

## 2023-08-06 DIAGNOSIS — M7989 Other specified soft tissue disorders: Secondary | ICD-10-CM | POA: Insufficient documentation

## 2023-08-06 DIAGNOSIS — I10 Essential (primary) hypertension: Secondary | ICD-10-CM

## 2023-08-06 DIAGNOSIS — S63501A Unspecified sprain of right wrist, initial encounter: Secondary | ICD-10-CM | POA: Insufficient documentation

## 2023-08-06 MED ORDER — AMLODIPINE BESYLATE 5 MG PO TABS: 5.0000 mg | ORAL_TABLET | Freq: Every day | ORAL | 0 refills | Status: AC

## 2023-08-06 NOTE — ED Provider Notes (Addendum)
 Carmen Peters Provider Note   CSN: [id number] Arrival date & time: 08/06/23  1743     History  Chief Complaint  Patient presents with   Hand Injury    Carmen Peters is a 43 y.o. female.  Patient is a 43 year old female who presents with right wrist pain.  She states she was standing on a chair and fell yesterday, catching herself with her right hand.  She has had pain and swelling to her right wrist since that time.  She denies any prior injuries to the wrist.  She has been taking Tylenol and ibuprofen.  History is obtained through Spanish video interpreter.       Home Medications Prior to Admission medications   Medication Sig Start Date End Date Taking? Authorizing Provider  acetaminophen (TYLENOL) 500 MG tablet Take 1 tablet (500 mg total) by mouth every 6 (six) hours as needed. Patient not taking: No sig reported 08/02/14   Piepenbrink, Victorino Dike, PA-C  cephALEXin (KEFLEX) 500 MG capsule Take 1 capsule (500 mg total) by mouth 4 (four) times daily. Patient not taking: No sig reported 08/15/16   Liberty Handy, PA-C  famotidine (PEPCID) 20 MG tablet Take 1 tablet (20 mg total) by mouth 2 (two) times daily. Patient not taking: No sig reported 04/20/13   Earley Favor, NP  HYDROcodone-acetaminophen (NORCO/VICODIN) 5-325 MG tablet Take 1 tablet by mouth every 6 (six) hours as needed for severe pain. Patient not taking: No sig reported 07/18/20   Caccavale, Sophia, PA-C  ibuprofen (ADVIL) 800 MG tablet Take 1 tablet (800 mg total) by mouth 3 (three) times daily with meals as needed for headache, moderate pain or cramping. Patient not taking: No sig reported 10/20/20   Warden Fillers, MD  naproxen (NAPROSYN) 500 MG tablet Take 1 tablet (500 mg total) by mouth 2 (two) times daily. Patient not taking: No sig reported 07/18/20   Caccavale, Sophia, PA-C  triamcinolone cream (KENALOG) 0.5 % Apply topically 2 (two) times daily. To skin  lesions Patient not taking: No sig reported 04/20/13   Earley Favor, NP      Allergies    Patient has no known allergies.    Review of Systems   Review of Systems  Constitutional:  Negative for fever.  Gastrointestinal:  Negative for nausea and vomiting.  Musculoskeletal:  Positive for arthralgias and joint swelling. Negative for back pain and neck pain.  Skin:  Negative for wound.  Neurological:  Negative for weakness, numbness and headaches.    Physical Exam Updated Vital Signs BP (!) 190/107 (BP Location: Left Arm)   Pulse 87   Temp 98.3 F (36.8 C)   Resp 16   Ht 5\' 1"  (1.549 m)   Wt 74.4 kg   SpO2 99%   BMI 30.99 kg/m  Physical Exam Constitutional:      Appearance: She is well-developed.  HENT:     Head: Normocephalic and atraumatic.  Cardiovascular:     Rate and Rhythm: Normal rate.  Pulmonary:     Effort: Pulmonary effort is normal.  Musculoskeletal:        General: Tenderness present.     Cervical back: Normal range of motion and neck supple.     Comments: Positive tenderness to the right wrist on the radial side.  There is some tenderness in the snuffbox.  There are some mild generalized swelling to the area.  No deformity noted.  No pain to the elbow or remainder of  the hand.  She is neurovascularly intact distally.  No wounds.  Skin:    General: Skin is warm and dry.  Neurological:     Mental Status: She is alert and oriented to person, place, and time.     ED Results / Procedures / Treatments   Labs (all labs ordered are listed, but only abnormal results are displayed) Labs Reviewed - No data to display  EKG None  Radiology DG Wrist Complete Right Result Date: 08/06/2023 CLINICAL DATA:  Lateral wrist pain following fall yesterday, initial encounter EXAM: RIGHT WRIST - COMPLETE 3+ VIEW COMPARISON:  None Available. FINDINGS: There is no evidence of fracture or dislocation. There is no evidence of arthropathy or other focal bone abnormality. Soft  tissues are unremarkable. IMPRESSION: No acute abnormality noted. Electronically Signed   By: Alcide Clever M.D.   On: 08/06/2023 19:15    Procedures Procedures    Medications Ordered in ED Medications - No data to display  ED Course/ Medical Decision Making/ A&P                                 Medical Decision Making Amount and/or Complexity of Data Reviewed Radiology: ordered.  Risk Prescription drug management.   Patient is a 43 year old who presents with right wrist pain.  X-rays were performed which were interpreted by me and confirmed by the radiologist to show no evidence of fracture.  She does have some tenderness over the scaphoid bone.  Will place her in a thumb spica.  Will refer her to follow-up with orthopedics.  She was advised on symptomatic care.  Return precautions were given.  Patient's blood pressure was noted to be elevated.  She notes that she has had elevated blood pressure readings in the past.  She is not currently on medication.  Will start her on medication and put referral in for outpatient follow-up.  Final Clinical Impression(s) / ED Diagnoses Final diagnoses:  Wrist sprain, right, initial encounter    Rx / DC Orders ED Discharge Orders     None         Rolan Bucco, MD 08/06/23 Aretha Parrot    Rolan Bucco, MD 08/06/23 [phone]

## 2023-08-06 NOTE — ED Triage Notes (Signed)
 Pt arrived via POV. C/o R hand pain following falling out of chair yesterday and catching themselves with their hand.  Full ROM

## 2023-08-06 NOTE — Discharge Instructions (Addendum)

## 2023-08-08 ENCOUNTER — Telehealth: Payer: Self-pay

## 2023-08-08 NOTE — Progress Notes (Unsigned)
 Care Guide Pharmacy Note  08/08/2023 Name: Jawana Reagor MRN: [id number] DOB: 19-Oct-1980  Referred By: Patient, No Pcp Per Reason for referral: Care Coordination (Outreach to schedule with Pharm d )   Henretter Piekarski is a 43 y.o. year old female who is a primary care patient of Patient, No Pcp Per.  Samaira Holzworth was referred to the pharmacist for assistance related to: HTN  An unsuccessful telephone outreach was attempted today to contact the patient who was referred to the pharmacy team for assistance with medication management. Additional attempts will be made to contact the patient.  Penne Lash , RMA     [facility]  [facility], [facility] Guide  Direct Dial: [phone]  Website: [email or web address]

## 2023-08-09 NOTE — Progress Notes (Signed)
 Care Guide Pharmacy Note  08/09/2023 Name: Verlinda Slotnick MRN: [id number] DOB: Mar 27, 1981  Referred By: Patient, No Pcp Per Reason for referral: Care Coordination (Outreach to schedule with Pharm d )   Thaila Bottoms is a 43 y.o. year old female who is a primary care patient of Patient, No Pcp Per.  Celese Banner was referred to the pharmacist for assistance related to: HTN  Successful contact was made with the patient to discuss pharmacy services including being ready for the pharmacist to call at least 5 minutes before the scheduled appointment time and to have medication bottles and any blood pressure readings ready for review. The patient agreed to meet with the pharmacist via telephone visit on (date/time).08/12/2023  Penne Lash , RMA     [place]  [facility], [facility] Guide  Direct Dial: [phone]  Website: [email or web address]

## 2023-08-12 ENCOUNTER — Other Ambulatory Visit: Payer: Self-pay

## 2023-08-12 NOTE — Progress Notes (Unsigned)
   08/12/2023  Patient ID: Carmen Peters, female   DOB: 1981/02/02, 43 y.o.   MRN: [id number]  Attempted to contact patient for scheduled appointment for HTN ED follow up. Left HIPAA compliant message for patient to return my call at their convenience.   Sherrill Raring, PharmD Clinical Pharmacist [phone]

## 2023-09-23 ENCOUNTER — Other Ambulatory Visit: Payer: Self-pay

## 2023-09-23 NOTE — Progress Notes (Signed)
   09/23/2023  Patient ID: Carmen Peters, female   DOB: 01/08/1981, 43 y.o.   MRN: [id number]  Contacted patient via telephone with assistance of Spanish interpreter for ED HTN f/u visit.  Patient confirms she has and is taking Amlodipine  5mg  daily. Based upon fill dates, patient should have been out by now but reports she has been taking consistently the last few weeks.  Does complain of strong headaches since leaving ER, denies any other symptoms. Checks BP while on the phone with home monitor and reports reading of 181/109, HR 89.  Does not have a PCP.  Given symptoms of headache, elevated BP reading despite having taken medication as prescribed, patient was instructed to go to the ER. Counseled patient on potential health risks given current situation.  Patient reports she will head to the ER now.   Carnell Christian, PharmD Clinical Pharmacist [phone]
